# Patient Record
Sex: Male | Born: 1965 | Race: White | Hispanic: No | Marital: Married | Smoking: Never smoker
Health system: Southern US, Community
[De-identification: ages and names within clinical notes are randomized; demographics above are authoritative.]

## PROBLEM LIST (undated history)

## (undated) DIAGNOSIS — M6208 Separation of muscle (nontraumatic), other site: Secondary | ICD-10-CM

## (undated) DIAGNOSIS — I10 Essential (primary) hypertension: Secondary | ICD-10-CM

## (undated) DIAGNOSIS — F329 Major depressive disorder, single episode, unspecified: Secondary | ICD-10-CM

## (undated) DIAGNOSIS — K429 Umbilical hernia without obstruction or gangrene: Secondary | ICD-10-CM

## (undated) DIAGNOSIS — F32A Depression, unspecified: Secondary | ICD-10-CM

## (undated) DIAGNOSIS — E785 Hyperlipidemia, unspecified: Secondary | ICD-10-CM

## (undated) DIAGNOSIS — H5440 Blindness, one eye, unspecified eye: Secondary | ICD-10-CM

## (undated) DIAGNOSIS — F419 Anxiety disorder, unspecified: Secondary | ICD-10-CM

## (undated) HISTORY — PX: SHOULDER SURGERY: SHX246

## (undated) HISTORY — PX: KNEE SURGERY: SHX244

## (undated) HISTORY — PX: HERNIA REPAIR: SHX51

## (undated) HISTORY — PX: OTHER SURGICAL HISTORY: SHX169

## (undated) HISTORY — PX: ORCHIECTOMY: SHX2116

---

## 1998-07-06 ENCOUNTER — Encounter: Payer: Self-pay | Admitting: *Deleted

## 1998-07-06 ENCOUNTER — Emergency Department (HOSPITAL_COMMUNITY): Admission: EM | Admit: 1998-07-06 | Discharge: 1998-07-06 | Payer: Self-pay | Admitting: *Deleted

## 2005-07-28 ENCOUNTER — Ambulatory Visit: Payer: Self-pay | Admitting: Physical Medicine & Rehabilitation

## 2005-07-28 ENCOUNTER — Encounter
Admission: RE | Admit: 2005-07-28 | Discharge: 2005-10-26 | Payer: Self-pay | Admitting: Physical Medicine & Rehabilitation

## 2005-09-02 ENCOUNTER — Ambulatory Visit: Payer: Self-pay | Admitting: Physical Medicine & Rehabilitation

## 2005-10-04 ENCOUNTER — Ambulatory Visit: Payer: Self-pay | Admitting: Physical Medicine & Rehabilitation

## 2005-11-04 ENCOUNTER — Encounter
Admission: RE | Admit: 2005-11-04 | Discharge: 2006-02-02 | Payer: Self-pay | Admitting: Physical Medicine & Rehabilitation

## 2005-11-04 ENCOUNTER — Ambulatory Visit: Payer: Self-pay | Admitting: Physical Medicine & Rehabilitation

## 2006-01-05 ENCOUNTER — Ambulatory Visit: Payer: Self-pay | Admitting: Physical Medicine & Rehabilitation

## 2006-02-06 ENCOUNTER — Encounter
Admission: RE | Admit: 2006-02-06 | Discharge: 2006-05-07 | Payer: Self-pay | Admitting: Physical Medicine & Rehabilitation

## 2006-02-08 ENCOUNTER — Ambulatory Visit: Payer: Self-pay | Admitting: Physical Medicine & Rehabilitation

## 2006-03-13 ENCOUNTER — Ambulatory Visit: Payer: Self-pay | Admitting: Physical Medicine & Rehabilitation

## 2013-09-30 ENCOUNTER — Ambulatory Visit: Payer: Self-pay | Admitting: Surgery

## 2014-01-28 DIAGNOSIS — F419 Anxiety disorder, unspecified: Secondary | ICD-10-CM

## 2014-01-28 DIAGNOSIS — F329 Major depressive disorder, single episode, unspecified: Secondary | ICD-10-CM | POA: Insufficient documentation

## 2014-01-28 DIAGNOSIS — F32A Depression, unspecified: Secondary | ICD-10-CM | POA: Insufficient documentation

## 2014-01-28 DIAGNOSIS — I10 Essential (primary) hypertension: Secondary | ICD-10-CM | POA: Insufficient documentation

## 2014-01-28 DIAGNOSIS — E785 Hyperlipidemia, unspecified: Secondary | ICD-10-CM | POA: Insufficient documentation

## 2016-02-18 ENCOUNTER — Encounter: Payer: Self-pay | Admitting: *Deleted

## 2016-02-19 ENCOUNTER — Encounter: Payer: Self-pay | Admitting: Anesthesiology

## 2016-02-19 ENCOUNTER — Ambulatory Visit
Admission: RE | Admit: 2016-02-19 | Discharge: 2016-02-19 | Disposition: A | Discharge status: Home / Self Care | Payer: No Typology Code available for payment source | Source: Ambulatory Visit | Attending: Gastroenterology | Admitting: Gastroenterology

## 2016-02-19 ENCOUNTER — Encounter
Admission: RE | Disposition: A | Discharge status: Home / Self Care | Payer: Self-pay | Source: Ambulatory Visit | Attending: Gastroenterology

## 2016-02-19 ENCOUNTER — Ambulatory Visit: Payer: No Typology Code available for payment source | Admitting: Anesthesiology

## 2016-02-19 DIAGNOSIS — Z7982 Long term (current) use of aspirin: Secondary | ICD-10-CM | POA: Insufficient documentation

## 2016-02-19 DIAGNOSIS — Z8371 Family history of colonic polyps: Secondary | ICD-10-CM | POA: Diagnosis not present

## 2016-02-19 DIAGNOSIS — E785 Hyperlipidemia, unspecified: Secondary | ICD-10-CM | POA: Insufficient documentation

## 2016-02-19 DIAGNOSIS — F419 Anxiety disorder, unspecified: Secondary | ICD-10-CM | POA: Insufficient documentation

## 2016-02-19 DIAGNOSIS — K621 Rectal polyp: Secondary | ICD-10-CM | POA: Diagnosis not present

## 2016-02-19 DIAGNOSIS — Z1211 Encounter for screening for malignant neoplasm of colon: Secondary | ICD-10-CM | POA: Insufficient documentation

## 2016-02-19 DIAGNOSIS — F329 Major depressive disorder, single episode, unspecified: Secondary | ICD-10-CM | POA: Diagnosis not present

## 2016-02-19 DIAGNOSIS — Z79899 Other long term (current) drug therapy: Secondary | ICD-10-CM | POA: Insufficient documentation

## 2016-02-19 DIAGNOSIS — I10 Essential (primary) hypertension: Secondary | ICD-10-CM | POA: Insufficient documentation

## 2016-02-19 HISTORY — DX: Major depressive disorder, single episode, unspecified: F32.9

## 2016-02-19 HISTORY — DX: Essential (primary) hypertension: I10

## 2016-02-19 HISTORY — DX: Hyperlipidemia, unspecified: E78.5

## 2016-02-19 HISTORY — DX: Blindness, one eye, unspecified eye: H54.40

## 2016-02-19 HISTORY — DX: Depression, unspecified: F32.A

## 2016-02-19 HISTORY — DX: Anxiety disorder, unspecified: F41.9

## 2016-02-19 HISTORY — PX: COLONOSCOPY WITH PROPOFOL: SHX5780

## 2016-02-19 SURGERY — COLONOSCOPY WITH PROPOFOL
Anesthesia: General

## 2016-02-19 MED ORDER — PROPOFOL 10 MG/ML IV BOLUS
INTRAVENOUS | Status: DC | PRN
  Administered 2016-02-19: 90 mg via INTRAVENOUS

## 2016-02-19 MED ORDER — FENTANYL CITRATE (PF) 100 MCG/2ML IJ SOLN
INTRAMUSCULAR | Status: DC | PRN
  Administered 2016-02-19: 50 ug via INTRAVENOUS

## 2016-02-19 MED ORDER — SODIUM CHLORIDE 0.9 % IV SOLN: INTRAVENOUS | Status: DC

## 2016-02-19 MED ORDER — SODIUM CHLORIDE 0.9 % IV SOLN
INTRAVENOUS | Status: DC
  Administered 2016-02-19: 1000 mL via INTRAVENOUS
  Administered 2016-02-19: 16:00:00 via INTRAVENOUS

## 2016-02-19 MED ORDER — PROPOFOL 500 MG/50ML IV EMUL
INTRAVENOUS | Status: DC | PRN
  Administered 2016-02-19: 150 ug/kg/min via INTRAVENOUS

## 2016-02-19 MED ORDER — MIDAZOLAM HCL 2 MG/2ML IJ SOLN
INTRAMUSCULAR | Status: DC | PRN
  Administered 2016-02-19: 1 mg via INTRAVENOUS
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
## 2016-02-19 NOTE — H&P (Signed)
Outpatient short stay form Pre-procedure 02/19/2016 4:03 PM Christena DeemMartin U Oluwasemilore Bahl MD  Primary Physician: Dr. Aram BeechamJeffrey Sparks  Reason for visit:  Screening colonoscopy  History of present illness:  Patient is a 50 year old male presenting today for colonoscopy. This is first colonoscopy. He does take a daily 81 mg aspirin but has held that for several days. He takes no other aspirin products or blood thinning agents. He tolerated his prep well.    Current facility-administered medications:  .  0.9 %  sodium chloride infusion, , Intravenous, Continuous, Christena DeemMartin U Lilja Soland, MD, Last Rate: 20 mL/hr at 02/19/16 1500, 1,000 mL at 02/19/16 1500 .  0.9 %  sodium chloride infusion, , Intravenous, Continuous, Christena DeemMartin U Keiandre Cygan, MD  Prescriptions prior to admission  Medication Sig Dispense Refill Last Dose  . aspirin EC 81 MG tablet Take 81 mg by mouth daily.   Past Week at Unknown time  . bisoprolol-hydrochlorothiazide (ZIAC) 5-6.25 MG tablet Take 1 tablet by mouth daily.   02/18/2016 at Unknown time  . busPIRone (BUSPAR) 15 MG tablet Take 15 mg by mouth 2 (two) times daily.   02/18/2016 at Unknown time  . sertraline (ZOLOFT) 100 MG tablet Take 100 mg by mouth daily.   02/18/2016 at Unknown time  . sertraline (ZOLOFT) 50 MG tablet Take 50 mg by mouth at bedtime.   02/18/2016 at Unknown time     No Known Allergies   Past Medical History  Diagnosis Date  . Anxiety   . Hyperlipidemia   . Hypertension   . 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 Depression     Review of systems:      Physical Exam    Heart and lungs: Regular rate and rhythm without rub or gallop, lungs are bilaterally clear.    HEENT: Normocephalic atraumatic eyes are anicteric    Other:     Pertinant exam for procedure: Soft nontender nondistended bowel sounds positive normoactive.    Planned proceedures: Colonoscopy and indicated procedures. I have discussed the risks benefits and complications of procedures to include not  limited to bleeding, infection, perforation and the risk of sedation and the patient wishes to proceed.    Christena DeemMartin U Mariaeduarda Defranco, MD Gastroenterology 02/19/2016  4:03 PM

## 2016-02-19 NOTE — Anesthesia Postprocedure Evaluation (Signed)
Anesthesia Post Note  Patient: Michael Cooley  Procedure(s) Performed: Procedure(s) (LRB): COLONOSCOPY WITH PROPOFOL (N/A)  Patient location during evaluation: PACU Anesthesia Type: General Level of consciousness: awake and alert Pain management: pain level controlled Vital Signs Assessment: post-procedure vital signs reviewed and stable Respiratory status: spontaneous breathing, nonlabored ventilation, respiratory function stable and patient connected to nasal cannula oxygen Cardiovascular status: blood pressure returned to baseline and stable Postop Assessment: no signs of nausea or vomiting Anesthetic complications: no    Last Vitals:  Filed Vitals:   02/19/16 1654 02/19/16 1704  BP: 108/71 113/74  Pulse: 49 51  Temp:    Resp: 18 15    Last Pain: There were no vitals filed for this visit.               Yevette EdwardsJames G Adams

## 2016-02-19 NOTE — Transfer of Care (Signed)
Immediate Anesthesia Transfer of Care Note  Patient: Michael Cooley  Procedure(s) Performed: Procedure(s): COLONOSCOPY WITH PROPOFOL (N/A)  Patient Location: PACU and Endoscopy Unit  Anesthesia Type:General  Level of Consciousness: sedated  Airway & Oxygen Therapy: Patient Spontanous Breathing and Patient connected to nasal cannula oxygen  Post-op Assessment: Report given to RN and Post -op Vital signs reviewed and stable  Post vital signs: Reviewed and stable  Last Vitals:  Filed Vitals:   02/19/16 1501 02/19/16 1634  BP: 126/82 92/61  Pulse: 60 56  Temp: 36.3 C 35.8 C  Resp: 20 22    Complications: No apparent anesthesia complications

## 2016-02-19 NOTE — Anesthesia Procedure Notes (Signed)
Date/Time: 02/19/2016 4:16 PM Performed by: Junious SilkNOLES, Michael Spark Pre-anesthesia Checklist: Patient identified, Emergency Drugs available, Suction available, Patient being monitored and Timeout performed Oxygen Delivery Method: Nasal cannula

## 2016-02-19 NOTE — Op Note (Signed)
[facility] Gastroenterology Patient Name: Michael Cooley Procedure Date: 02/19/2016 3:57 PM MRN: [id number] Account #: [id number] Date of Birth: [birth year] Admit Type: Outpatient Age: 50 Room: [facility] ENDO ROOM 3 Gender: Male Note Status: Finalized Procedure:            Colonoscopy Indications:          Colon cancer screening in patient at increased risk:                        Family history of 1st-degree relative with colon polyps Providers:            Christena Deem, MD Referring MD:         Duane Lope. Judithann Sheen, MD (Referring MD) Medicines:            Monitored Anesthesia Care Complications:        No immediate complications. Procedure:            Pre-Anesthesia Assessment:                       - ASA Grade Assessment: II - A patient with mild                        systemic disease.                       After obtaining informed consent, the colonoscope was                        passed under direct vision. Throughout the procedure,                        the patient's blood pressure, pulse, and oxygen                        saturations were monitored continuously. The                        Colonoscope was introduced through the anus and                        advanced to the the cecum, identified by appendiceal                        orifice and ileocecal valve. The patient tolerated the                        procedure well. The quality of the bowel preparation                        was good except the ascending colon was fair. Findings:      A 2 mm polyp was found in the rectum. The polyp was sessile. The polyp       was removed with a cold biopsy forceps. Resection and retrieval were       complete.      The exam was otherwise without abnormality.      The digital rectal exam was normal. Impression:           - One 2 mm polyp in the rectum, removed with a cold  biopsy forceps. Resected and retrieved.                       - The  examination was otherwise normal. Recommendation:       - Discharge patient to home.                       - Telephone GI clinic for pathology results in 1 week. Procedure Code(s):    --- Professional ---                       [id number], Colonoscopy, flexible; with biopsy, single or                        multiple Diagnosis Code(s):    --- Professional ---                       Z83.71, Family history of colonic polyps                       K62.1, Rectal polyp CPT copyright 2016 American Medical Association. All rights reserved. The codes documented in this report are preliminary and upon coder review may  be revised to meet current compliance requirements. Christena DeemMartin U Vail Vuncannon, MD 02/19/2016 4:29:30 PM This report has been signed electronically. Number of Addenda: 0 Note Initiated On: 02/19/2016 3:57 PM Scope Withdrawal Time: 0 hours 7 minutes 29 seconds  Total Procedure Duration: 0 hours 14 minutes 59 seconds       [facility]

## 2016-02-22 ENCOUNTER — Encounter: Payer: Self-pay | Admitting: Gastroenterology

## 2016-02-23 LAB — SURGICAL PATHOLOGY

## 2017-02-01 ENCOUNTER — Ambulatory Visit (INDEPENDENT_AMBULATORY_CARE_PROVIDER_SITE_OTHER): Payer: Worker's Compensation

## 2017-02-01 ENCOUNTER — Encounter: Payer: Self-pay | Admitting: Emergency Medicine

## 2017-02-01 ENCOUNTER — Ambulatory Visit (INDEPENDENT_AMBULATORY_CARE_PROVIDER_SITE_OTHER): Payer: Worker's Compensation | Admitting: Emergency Medicine

## 2017-02-01 VITALS — BP 145/84 | HR 78 | Temp 98.3°F | Resp 18 | Ht 64.57 in | Wt 172.8 lb

## 2017-02-01 DIAGNOSIS — M549 Dorsalgia, unspecified: Secondary | ICD-10-CM

## 2017-02-01 DIAGNOSIS — M542 Cervicalgia: Secondary | ICD-10-CM

## 2017-02-01 MED ORDER — DICLOFENAC SODIUM 75 MG PO TBEC: 75.0000 mg | DELAYED_RELEASE_TABLET | Freq: Two times a day (BID) | ORAL | 0 refills | Status: AC

## 2017-02-01 MED ORDER — CYCLOBENZAPRINE HCL 10 MG PO TABS: 10.0000 mg | ORAL_TABLET | Freq: Three times a day (TID) | ORAL | 0 refills | Status: DC | PRN
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
## 2017-06-22 ENCOUNTER — Encounter
Admission: RE | Admit: 2017-06-22 | Discharge: 2017-06-22 | Disposition: A | Discharge status: Home / Self Care | Payer: No Typology Code available for payment source | Source: Ambulatory Visit | Attending: Orthopedic Surgery | Admitting: Orthopedic Surgery

## 2017-06-22 DIAGNOSIS — I10 Essential (primary) hypertension: Secondary | ICD-10-CM | POA: Diagnosis not present

## 2017-06-22 DIAGNOSIS — Z01810 Encounter for preprocedural cardiovascular examination: Secondary | ICD-10-CM | POA: Insufficient documentation

## 2017-06-22 DIAGNOSIS — R001 Bradycardia, unspecified: Secondary | ICD-10-CM | POA: Diagnosis not present

## 2017-06-22 DIAGNOSIS — E785 Hyperlipidemia, unspecified: Secondary | ICD-10-CM | POA: Diagnosis not present

## 2017-06-22 NOTE — Patient Instructions (Signed)
Your procedure is scheduled on: Thursday 06/29/17 Report to DAY SURGERY. 2ND FLOOR MEDICAL MALL ENTRANCE. To find out your arrival time please call [phone] between 1PM - 3PM on Wednesday 06/28/17.  Remember: Instructions that are not followed completely may result in serious medical risk, up to and including death, or upon the discretion of your surgeon and anesthesiologist your surgery may need to be rescheduled.    __X__ 1. Do not eat anything after midnight the night before your    procedure.  No gum chewing or hard candies.  You may drink clear   liquids up to 2 hours before you are scheduled to arrive at the   hospital for your procedure. Do not drink clear liquids within 2   hours of scheduled arrival to the hospital as this may lead to your   procedure being delayed or rescheduled.       Clear liquids include:   Water or Apple juice without pulp   Clear carbohydrate beverage such as Clearfast or Gatorade   Black coffee or Clear Tea (no milk, no creamer, do not add anything   to the coffee or tea)    Diabetics should only drink water   __X__ 2. No Alcohol for 24 hours before or after surgery.   ____ 3. Bring all medications with you on the day of surgery if instructed.    __X__ 4. Notify your doctor if there is any change in your medical condition     (cold, fever, infections).             __X___5. No smoking within 24 hours of your surgery.     Do not wear jewelry, make-up, hairpins, clips or nail polish.  Do not wear lotions, powders, or perfumes.   Do not shave 48 hours prior to surgery. Men may shave face and neck.  Do not bring valuables to the hospital.    [facility] is not responsible for any belongings or valuables.               Contacts, dentures or bridgework may not be worn into surgery.  Leave your suitcase in the car. After surgery it may be brought to your room.  For patients admitted to the hospital, discharge time is determined by your                 treatment team.   Patients discharged the day of surgery will not be allowed to drive home.   Please read over the following fact sheets that you were given:   MRSA Information   __x__ Take these medicines the morning of surgery with A SIP OF WATER:    1. None, take medications at bedtime as ususal  2.   3.   4.  5.  6.  ____ Fleet Enema (as directed)   __x__ Use CHG Soap/SAGE wipes as directed  ____ Use inhalers on the day of surgery  ____ Stop metformin 2 days prior to surgery    ____ Take 1/2 of usual insulin dose the night before surgery and none on the morning of surgery.   __x__ Stop Coumadin/Plavix/aspirin on Today (7 days before)  __X__ Stop Anti-inflammatories such as Advil, Aleve, Ibuprofen, Motrin, Naproxen, Naprosyn, Goodies,powder, or aspirin products.  OK to take Tylenol.   __X__ Stop supplements, Vitamin E, Fish Oil until after surgery.    ____ Bring C-Pap to the hospital.

## 2017-06-23 NOTE — Pre-Procedure Instructions (Signed)
EKG sent to Anesthesia for review. 

## 2017-06-28 MED ORDER — CEFAZOLIN SODIUM-DEXTROSE 2-4 GM/100ML-% IV SOLN
2.0000 g | Freq: Once | INTRAVENOUS | Status: AC
  Administered 2017-06-29: 2 g via INTRAVENOUS

## 2017-06-29 ENCOUNTER — Ambulatory Visit: Payer: No Typology Code available for payment source | Admitting: Anesthesiology

## 2017-06-29 ENCOUNTER — Encounter
Admission: RE | Disposition: A | Discharge status: Home / Self Care | Payer: Self-pay | Source: Ambulatory Visit | Attending: Orthopedic Surgery

## 2017-06-29 ENCOUNTER — Ambulatory Visit
Admission: RE | Admit: 2017-06-29 | Discharge: 2017-06-29 | Disposition: A | Discharge status: Home / Self Care | Payer: No Typology Code available for payment source | Source: Ambulatory Visit | Attending: Orthopedic Surgery | Admitting: Orthopedic Surgery

## 2017-06-29 DIAGNOSIS — I10 Essential (primary) hypertension: Secondary | ICD-10-CM | POA: Insufficient documentation

## 2017-06-29 DIAGNOSIS — E785 Hyperlipidemia, unspecified: Secondary | ICD-10-CM | POA: Insufficient documentation

## 2017-06-29 DIAGNOSIS — F419 Anxiety disorder, unspecified: Secondary | ICD-10-CM | POA: Insufficient documentation

## 2017-06-29 DIAGNOSIS — M67432 Ganglion, left wrist: Secondary | ICD-10-CM | POA: Diagnosis not present

## 2017-06-29 DIAGNOSIS — Z7982 Long term (current) use of aspirin: Secondary | ICD-10-CM | POA: Insufficient documentation

## 2017-06-29 DIAGNOSIS — Z79899 Other long term (current) drug therapy: Secondary | ICD-10-CM | POA: Diagnosis not present

## 2017-06-29 HISTORY — PX: GANGLION CYST EXCISION: SHX1691

## 2017-06-29 SURGERY — EXCISION, GANGLION CYST, WRIST
Anesthesia: General | Site: Wrist | Laterality: Left | Wound class: Clean

## 2017-06-29 MED ORDER — FAMOTIDINE 20 MG PO TABS
ORAL_TABLET | ORAL | Status: AC
  Administered 2017-06-29: 20 mg via ORAL
  Filled 2017-06-29: qty 1

## 2017-06-29 MED ORDER — CEFAZOLIN SODIUM-DEXTROSE 2-4 GM/100ML-% IV SOLN
INTRAVENOUS | Status: AC
  Filled 2017-06-29: qty 100

## 2017-06-29 MED ORDER — LIDOCAINE HCL (CARDIAC) 20 MG/ML IV SOLN
INTRAVENOUS | Status: DC | PRN
  Administered 2017-06-29: 100 mg via INTRAVENOUS

## 2017-06-29 MED ORDER — FENTANYL CITRATE (PF) 100 MCG/2ML IJ SOLN
INTRAMUSCULAR | Status: AC
  Filled 2017-06-29: qty 2

## 2017-06-29 MED ORDER — MIDAZOLAM HCL 2 MG/2ML IJ SOLN
INTRAMUSCULAR | Status: DC | PRN
  Administered 2017-06-29: 2 mg via INTRAVENOUS

## 2017-06-29 MED ORDER — PROPOFOL 10 MG/ML IV BOLUS
INTRAVENOUS | Status: DC | PRN
  Administered 2017-06-29: 180 mg via INTRAVENOUS

## 2017-06-29 MED ORDER — FENTANYL CITRATE (PF) 100 MCG/2ML IJ SOLN
INTRAMUSCULAR | Status: AC
  Administered 2017-06-29: 25 ug via INTRAVENOUS
  Filled 2017-06-29: qty 2

## 2017-06-29 MED ORDER — DEXAMETHASONE SODIUM PHOSPHATE 10 MG/ML IJ SOLN
INTRAMUSCULAR | Status: DC | PRN
  Administered 2017-06-29: 10 mg via INTRAVENOUS

## 2017-06-29 MED ORDER — BUPIVACAINE HCL (PF) 0.5 % IJ SOLN
INTRAMUSCULAR | Status: AC
  Filled 2017-06-29: qty 30

## 2017-06-29 MED ORDER — LACTATED RINGERS IV SOLN
INTRAVENOUS | Status: DC
  Administered 2017-06-29: 12:00:00 via INTRAVENOUS

## 2017-06-29 MED ORDER — HYDROCODONE-ACETAMINOPHEN 5-325 MG PO TABS: 1.0000 | ORAL_TABLET | Freq: Four times a day (QID) | ORAL | 0 refills | Status: DC | PRN

## 2017-06-29 MED ORDER — FAMOTIDINE 20 MG PO TABS
20.0000 mg | ORAL_TABLET | Freq: Once | ORAL | Status: AC
  Administered 2017-06-29: 20 mg via ORAL

## 2017-06-29 MED ORDER — PROMETHAZINE HCL 25 MG/ML IJ SOLN: 6.2500 mg | INTRAMUSCULAR | Status: DC | PRN

## 2017-06-29 MED ORDER — FENTANYL CITRATE (PF) 100 MCG/2ML IJ SOLN
25.0000 ug | INTRAMUSCULAR | Status: DC | PRN
  Administered 2017-06-29 (×2): 25 ug via INTRAVENOUS

## 2017-06-29 MED ORDER — DEXAMETHASONE SODIUM PHOSPHATE 10 MG/ML IJ SOLN
INTRAMUSCULAR | Status: AC
  Filled 2017-06-29: qty 1

## 2017-06-29 MED ORDER — PHENYLEPHRINE HCL 10 MG/ML IJ SOLN
INTRAMUSCULAR | Status: DC | PRN
  Administered 2017-06-29: 40 ug via INTRAVENOUS

## 2017-06-29 MED ORDER — FENTANYL CITRATE (PF) 100 MCG/2ML IJ SOLN
INTRAMUSCULAR | Status: DC | PRN
Start: 2017-06-29 — End: 2017-06-29
  Administered 2017-06-29 (×2): 50 ug via INTRAVENOUS

## 2017-06-29 MED ORDER — ONDANSETRON HCL 4 MG/2ML IJ SOLN
INTRAMUSCULAR | Status: DC | PRN
  Administered 2017-06-29: 4 mg via INTRAVENOUS

## 2017-06-29 MED ORDER — GLYCOPYRROLATE 0.2 MG/ML IJ SOLN
INTRAMUSCULAR | Status: AC
  Filled 2017-06-29: qty 1

## 2017-06-29 MED ORDER — MIDAZOLAM HCL 2 MG/2ML IJ SOLN
INTRAMUSCULAR | Status: AC
  Filled 2017-06-29: qty 2

## 2017-06-29 MED ORDER — SEVOFLURANE IN SOLN
RESPIRATORY_TRACT | Status: AC
  Filled 2017-06-29: qty 250

## 2017-06-29 MED ORDER — PROPOFOL 500 MG/50ML IV EMUL
INTRAVENOUS | Status: AC
  Filled 2017-06-29: qty 50

## 2017-06-29 MED ORDER — GLYCOPYRROLATE 0.2 MG/ML IJ SOLN
INTRAMUSCULAR | Status: DC | PRN
  Administered 2017-06-29: 0.2 mg via INTRAVENOUS

## 2017-06-29 MED ORDER — ONDANSETRON HCL 4 MG/2ML IJ SOLN
INTRAMUSCULAR | Status: AC
  Filled 2017-06-29: qty 2

## 2017-06-29 MED ORDER — SODIUM CHLORIDE 0.9 % IR SOLN
Status: DC | PRN
  Administered 2017-06-29: 500 mL

## 2017-06-29 SURGICAL SUPPLY — 29 items
BANDAGE ELASTIC 3 LF NS (GAUZE/BANDAGES/DRESSINGS) ×3 IMPLANT
CANISTER SUCT 1200ML W/VALVE (MISCELLANEOUS) ×3 IMPLANT
CAST PADDING 3X4FT ST 30246 (SOFTGOODS) ×2
CHLORAPREP W/TINT 26ML (MISCELLANEOUS) ×3 IMPLANT
CUFF TOURN 18 STER (MISCELLANEOUS) ×3 IMPLANT
ELECT CAUTERY NEEDLE 2.0 MIC (NEEDLE) ×3 IMPLANT
GAUZE PETRO XEROFOAM 1X8 (MISCELLANEOUS) ×3 IMPLANT
GAUZE SPONGE 4X4 12PLY STRL (GAUZE/BANDAGES/DRESSINGS) ×3 IMPLANT
GLOVE BIO SURGEON STRL SZ7.5 (GLOVE) ×3 IMPLANT
GLOVE BIOGEL PI IND STRL 7.0 (GLOVE) ×1 IMPLANT
GLOVE BIOGEL PI INDICATOR 7.0 (GLOVE) ×2
GLOVE SURG SYN 9.0  PF PI (GLOVE) ×2
GLOVE SURG SYN 9.0 PF PI (GLOVE) ×1 IMPLANT
GOWN SRG 2XL LVL 4 RGLN SLV (GOWNS) ×1 IMPLANT
GOWN STRL NON-REIN 2XL LVL4 (GOWNS) ×2
GOWN STRL REUS W/ TWL LRG LVL3 (GOWN DISPOSABLE) ×1 IMPLANT
GOWN STRL REUS W/TWL LRG LVL3 (GOWN DISPOSABLE) ×2
KIT RM TURNOVER STRD PROC AR (KITS) ×3 IMPLANT
NS IRRIG 500ML POUR BTL (IV SOLUTION) ×3 IMPLANT
PACK EXTREMITY ARMC (MISCELLANEOUS) ×3 IMPLANT
PAD CAST CTTN 3X4 STRL (SOFTGOODS) ×1 IMPLANT
SPLINT CAST 1 STEP 3X12 (MISCELLANEOUS) ×3 IMPLANT
SUT ETHILON 4-0 (SUTURE) ×2
SUT ETHILON 4-0 FS2 18XMFL BLK (SUTURE) ×1
SUT ETHILON 5-0 FS-2 18 BLK (SUTURE) ×3 IMPLANT
SUT MNCRL 4-0 (SUTURE)
SUT MNCRL 4-018XMFL (SUTURE)
SUTURE ETHLN 4-0 FS2 18XMF BLK (SUTURE) ×1 IMPLANT
SUTURE MNCRL 4-018XMF (SUTURE) IMPLANT

## 2017-06-29 NOTE — Op Note (Signed)
06/29/2017  2:19 PM  PATIENT:  Michael Cooley  51 y.o. male  PRE-OPERATIVE DIAGNOSIS:  GANGLION CYST OF VOLAR ASPECT OF LEFT WRIST   POST-OPERATIVE DIAGNOSIS:  GANGLION CYST OF VOLAR ASPECT OF LEFT WRIST   PROCEDURE:  Procedure(s): REMOVAL GANGLION OF WRIST (Left)  SURGEON: Leitha Schuller, MD  ASSISTANTS: None  ANESTHESIA:   general  EBL:  Total I/O In: 800 [I.V.:800] Out: 5 [Blood:5]  BLOOD ADMINISTERED:none  DRAINS: none   LOCAL MEDICATIONS USED:  NONE  SPECIMEN:  Source of Specimen:  Ganglion cyst left volar wrist  DISPOSITION OF SPECIMEN:  PATHOLOGY  COUNTS:  YES  TOURNIQUET:   Total Tourniquet Time Documented: Upper Arm (Left) - 6 minutes Total: Upper Arm (Left) - 6 minutes   IMPLANTS: None  DICTATION: .Dragon Dictation patient brought the operating room and after adequate anesthesia was obtained, the left arm was prepped and draped in sterile fashion. Tourniquet was applied to the upper arm and after appropriate patient education and timeout procedure were completed the tourniquet was raised to her 50 murmurs mercury. A curvilinear incision was made over the distal radius overlying the cyst which was then identified and separated from its adjacent tissue was then opened at the top and a portion of the capsule of the cyst was removed as a specimen. The base of the cyst was then identified at the joint at the radiocarpal joint and a rongeur was used to remove Ganglion around this followed by cauterization at the level of the capsule to try to cause scar tissue prevent recurrence. Tourniquet was let down at this point after removal of the mass and cauterizing the joint with hemostasis being achieved with electrocautery radial artery intact. The wound was irrigated and closed with simple interrupted 4-0 nylon skin sutures followed by Xeroform 4 x 4's web roll and a volar splint  PLAN OF CARE: Discharge to home after PACU  PATIENT DISPOSITION:  PACU - hemodynamically  stable.

## 2017-06-29 NOTE — Discharge Instructions (Signed)
Keep splint clean and dry. Work on finger motion is much as possible. Keep arm elevated. Pain medicine as directed

## 2017-06-29 NOTE — Transfer of Care (Signed)
Immediate Anesthesia Transfer of Care Note  Patient: Michael Cooley  Procedure(s) Performed: REMOVAL GANGLION OF WRIST (Left Wrist)  Patient Location: PACU  Anesthesia Type:General  Level of Consciousness: awake, alert  and oriented  Airway & Oxygen Therapy: Patient Spontanous Breathing and Patient connected to face mask oxygen  Post-op Assessment: Report given to RN and Post -op Vital signs reviewed and stable  Post vital signs: Reviewed and stable  Last Vitals:  Vitals:   06/29/17 1119  BP: 133/83  Pulse: (!) 52  Resp: 16  Temp: 36.6 C  SpO2: 100%    Last Pain:  Vitals:   06/29/17 1119  TempSrc: Oral  PainSc: 2          Complications: No apparent anesthesia complications

## 2017-06-29 NOTE — Anesthesia Procedure Notes (Signed)
Procedure Name: LMA Insertion Date/Time: 06/29/2017 2:01 PM Performed by: Paulette Blanch Pre-anesthesia Checklist: Patient identified, Patient being monitored, Timeout performed, Emergency Drugs available and Suction available Patient Re-evaluated:Patient Re-evaluated prior to induction Oxygen Delivery Method: Circle system utilized Preoxygenation: Pre-oxygenation with 100% oxygen Induction Type: IV induction Ventilation: Mask ventilation without difficulty LMA: LMA inserted LMA Size: 4.0 Tube type: Oral Number of attempts: 1 Placement Confirmation: positive ETCO2 and breath sounds checked- equal and bilateral Tube secured with: Tape Dental Injury: Teeth and Oropharynx as per pre-operative assessment

## 2017-06-29 NOTE — Anesthesia Post-op Follow-up Note (Signed)
Anesthesia QCDR form completed.        

## 2017-06-29 NOTE — Anesthesia Preprocedure Evaluation (Signed)
Anesthesia Evaluation  Patient identified by MRN, date of birth, ID band Patient awake    Reviewed: Allergy & Precautions, H&P , NPO status , Patient's Chart, lab work & pertinent test results, reviewed documented beta blocker date and time   History of Anesthesia Complications Negative for: history of anesthetic complications  Airway Mallampati: II  TM Distance: >3 FB Neck ROM: full    Dental  (+) Missing, Dental Advidsory Given, Teeth Intact   Pulmonary neg pulmonary ROS,           Cardiovascular Exercise Tolerance: Good hypertension, (-) angina(-) CAD, (-) Past MI, (-) Cardiac Stents and (-) CABG (-) dysrhythmias (-) Valvular Problems/Murmurs     Neuro/Psych PSYCHIATRIC DISORDERS (Depression) negative neurological ROS     GI/Hepatic negative GI ROS, Neg liver ROS,   Endo/Other  negative endocrine ROS  Renal/GU negative Renal ROS  negative genitourinary   Musculoskeletal   Abdominal   Peds  Hematology negative hematology ROS (+)   Anesthesia Other Findings Past Medical History: No date: Anxiety No date: Depression No date: Hyperlipidemia No date: Hypertension 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   Reproductive/Obstetrics negative OB ROS                             Anesthesia Physical Anesthesia Plan  ASA: II  Anesthesia Plan: General   Post-op Pain Management:    Induction: Intravenous  PONV Risk Score and Plan: 2 and Ondansetron and Dexamethasone  Airway Management Planned: LMA  Additional Equipment:   Intra-op Plan:   Post-operative Plan: Extubation in OR  Informed Consent: I have reviewed the patients History and Physical, chart, labs and discussed the procedure including the risks, benefits and alternatives for the proposed anesthesia with the patient or authorized representative who has indicated his/her understanding and acceptance.   Dental  Advisory Given  Plan Discussed with: Anesthesiologist, CRNA and Surgeon  Anesthesia Plan Comments:         Anesthesia Quick Evaluation

## 2017-06-29 NOTE — H&P (Signed)
Reviewed paper H+P, will be scanned into chart. No changes noted.  

## 2017-06-29 NOTE — Progress Notes (Signed)
Circulation positive to left hand   Can wiggle fingers  Skin warm and dry

## 2017-06-30 ENCOUNTER — Encounter: Payer: Self-pay | Admitting: Orthopedic Surgery

## 2017-06-30 NOTE — Anesthesia Postprocedure Evaluation (Signed)
Anesthesia Post Note  Patient: Michael Cooley  Procedure(s) Performed: REMOVAL GANGLION OF WRIST (Left Wrist)  Patient location during evaluation: PACU Anesthesia Type: General Level of consciousness: awake and alert Pain management: pain level controlled Vital Signs Assessment: post-procedure vital signs reviewed and stable Respiratory status: spontaneous breathing, nonlabored ventilation, respiratory function stable and patient connected to nasal cannula oxygen Cardiovascular status: blood pressure returned to baseline and stable Postop Assessment: no apparent nausea or vomiting Anesthetic complications: no     Last Vitals:  Vitals:   06/29/17 1528 06/29/17 1553  BP: (!) 111/52 110/71  Pulse: (!) 58 (!) 59  Resp: 16   Temp: (!) 36.3 C   SpO2: 94% 95%    Last Pain:  Vitals:   06/29/17 1528  TempSrc: Temporal  PainSc:                  Lenard Simmer

## 2017-07-03 LAB — SURGICAL PATHOLOGY

## 2017-11-15 ENCOUNTER — Other Ambulatory Visit: Payer: Self-pay | Admitting: Internal Medicine

## 2017-11-15 DIAGNOSIS — R0602 Shortness of breath: Secondary | ICD-10-CM

## 2017-11-16 ENCOUNTER — Ambulatory Visit
Admission: RE | Admit: 2017-11-16 | Discharge: 2017-11-16 | Disposition: A | Discharge status: Home / Self Care | Payer: BLUE CROSS/BLUE SHIELD | Source: Ambulatory Visit | Attending: Internal Medicine | Admitting: Internal Medicine

## 2017-11-16 DIAGNOSIS — R918 Other nonspecific abnormal finding of lung field: Secondary | ICD-10-CM | POA: Insufficient documentation

## 2017-11-16 DIAGNOSIS — R0602 Shortness of breath: Secondary | ICD-10-CM | POA: Diagnosis present

## 2017-11-16 DIAGNOSIS — I2510 Atherosclerotic heart disease of native coronary artery without angina pectoris: Secondary | ICD-10-CM | POA: Diagnosis not present

## 2017-11-16 DIAGNOSIS — J9811 Atelectasis: Secondary | ICD-10-CM | POA: Diagnosis not present

## 2017-11-16 MED ORDER — IOPAMIDOL (ISOVUE-370) INJECTION 76%
75.0000 mL | Freq: Once | INTRAVENOUS | Status: AC | PRN
  Administered 2017-11-16: 75 mL via INTRAVENOUS

## 2017-11-20 ENCOUNTER — Encounter: Payer: Self-pay | Admitting: Physician Assistant

## 2017-11-20 ENCOUNTER — Ambulatory Visit (INDEPENDENT_AMBULATORY_CARE_PROVIDER_SITE_OTHER): Payer: Self-pay | Admitting: Physician Assistant

## 2017-11-20 VITALS — BP 111/75 | HR 70 | Temp 97.8°F | Resp 16 | Ht 64.0 in | Wt 170.4 lb

## 2017-11-20 DIAGNOSIS — Z0289 Encounter for other administrative examinations: Secondary | ICD-10-CM

## 2017-11-20 NOTE — Progress Notes (Signed)
Airline pilotCommercial Driver Medical Examination   Suzanne Boronerry L Hunger is a 52 y.o. male with a pertinent medical history of well-controlled hypertension, mood disorder, dyslipidemia who presents today for a commercial driver fitness determination physical exam. The patient reports no problems today. In the past the patient reports receiving 1 year certificates. He denies focal neurological deficits, vision and hearing changes. He denies the habitual use of benzodiazepines, opioids, amphetamines and denies illicit drug use.   Current medications, family history, allergies, social history reviewed by me and exist elsewhere in the encounter.   Review of Systems  Constitutional: Negative for diaphoresis, malaise/fatigue and weight loss.  Respiratory: Negative for shortness of breath.   Cardiovascular: Negative for chest pain.  Genitourinary: Negative.   Neurological: Negative for dizziness, tingling, tremors, sensory change, speech change, focal weakness, seizures, loss of consciousness, weakness and headaches.    Objective:     Vision/hearing:  Visual Acuity Screening   Right eye Left eye Both eyes  Without correction:     With correction: 20/30 20/30 20/30   Comments: Color: passed Peripheral R 70 L 70  Hearing Screening Comments: Whisper test R 10 FEET L 10 FEET  Applicant can recognize and distinguish among traffic control signals and devices showing standard red, green, and amber colors.  Corrective lenses required: Yes  Monocular Vision?: No  Hearing aid requirement: No  Physical Exam  Constitutional: He appears well-developed. He is active and cooperative.  Non-toxic appearance.  HENT:  Right Ear: Hearing, tympanic membrane, external ear and ear canal normal.  Left Ear: Hearing, tympanic membrane, external ear and ear canal normal.  Nose: Nose normal. Right sinus exhibits no maxillary sinus tenderness and no frontal sinus tenderness. Left sinus exhibits no maxillary sinus  tenderness and no frontal sinus tenderness.  Mouth/Throat: Uvula is midline, oropharynx is clear and moist and mucous membranes are normal. No oropharyngeal exudate, posterior oropharyngeal edema or tonsillar abscesses.  Eyes: Conjunctivae are normal. Pupils are equal, round, and reactive to light.  Cardiovascular: Normal rate, regular rhythm, S1 normal, S2 normal, normal heart sounds, intact distal pulses and normal pulses. Exam reveals no gallop and no friction rub.  No murmur heard. Pulmonary/Chest: Effort normal. No stridor. No tachypnea. No respiratory distress. He has no wheezes. He has no rales.  Abdominal: He exhibits no distension.  Musculoskeletal: He exhibits no edema.  Lymphadenopathy:       Head (right side): No submandibular and no tonsillar adenopathy present.       Head (left side): No submandibular and no tonsillar adenopathy present.    He has no cervical adenopathy.  Neurological: He is alert.  Skin: Skin is warm and dry. He is not diaphoretic. No pallor.  Vitals reviewed.   BP 111/75 (BP Location: Right Arm, Patient Position: Sitting, Cuff Size: Large)   Pulse 70   Temp 97.8 F (36.6 C) (Oral)   Resp 16   Ht 5\' 4"  (1.626 m)   Wt 170 lb 6.4 oz (77.3 kg)   SpO2 96%   BMI 29.25 kg/m   Wt Readings from Last 3 Encounters:  11/20/17 170 lb 6.4 oz (77.3 kg)  06/29/17 174 lb (78.9 kg)  06/22/17 176 lb 3.2 oz (79.9 kg)   Temp Readings from Last 3 Encounters:  11/20/17 97.8 F (36.6 C) (Oral)  06/29/17 (!) 97.3 F (36.3 C) (Temporal)  02/01/17 98.3 F (36.8 C) (Oral)   BP Readings from Last 3 Encounters:  11/20/17 111/75  06/29/17 110/71  06/22/17 123/74   Pulse  Readings from Last 3 Encounters:  11/20/17 70  06/29/17 (!) 59  06/22/17 (!) 48     Labs: Comments: UA (SG:1.015; Protein:Neg; Blood: Neg; Glucose: Neg)    Assessment:    Healthy male exam.  Meets standards, but periodic monitoring required due to HTN.  Driver qualified only for 1 year.      Plan:    Medical examiners certificate completed and printed. Need follow-up in 1 year for recheck of HTN. Return as needed.    Deliah Boston, MS, PA-C 10:12 AM, 11/20/2017

## 2017-11-20 NOTE — Patient Instructions (Addendum)
  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  Thank you for choosing us for your care today.   IF you received an x-ray today, you will receive an [facility]. Please contact [facility] at [phone] with questions or concerns regarding your invoice.   IF you received labwork today, you will receive an invoice from [facility]. Please contact [facility] at [phone] with questions or concerns regarding your invoice.   Our billing staff will not be able to assist you with questions regarding bills from these companies.  You will be contacted with the lab results as soon as they are available. The fastest way to get your results is to activate your My Chart account. Instructions are located on the last page of this paperwork. If you have not heard from us regarding the results in 2 weeks, please contact this office.

## 2018-11-23 IMAGING — CT CT ANGIO CHEST
2 of 7 series · 18 of 46 positions shown · IV contrast (APPLIED)
Comparison: CT Abdomen and Pelvis 09/30/2013

CLINICAL DATA: 51-year-old male with shortness of breath. Recent
pneumonia, new onset upper chest pain.

EXAM:
CT ANGIOGRAPHY CHEST WITH CONTRAST
TECHNIQUE: Multidetector CT imaging of the chest was performed using the
standard protocol during bolus administration of intravenous
contrast. Multiplanar CT image reconstructions and MIPs were
obtained to evaluate the vascular anatomy.
CONTRAST:  75mL 11FHK9-OQM IOPAMIDOL (11FHK9-OQM) INJECTION 76%

[Series 5: thins (person_name) · axial · 0.66mm/px · z∈[-78,+167]mm · 15 of 277 slices shown]
[im 16/277  lung]
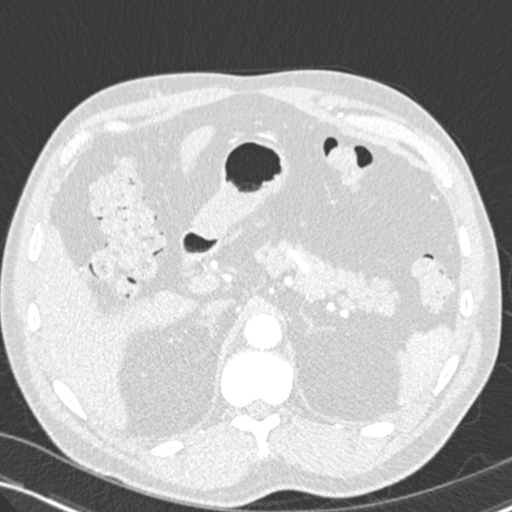
[im 31/277  soft-tissue]
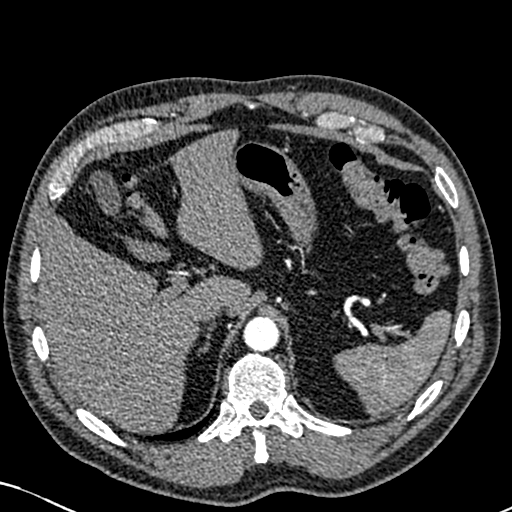
[im 47/277  lung]
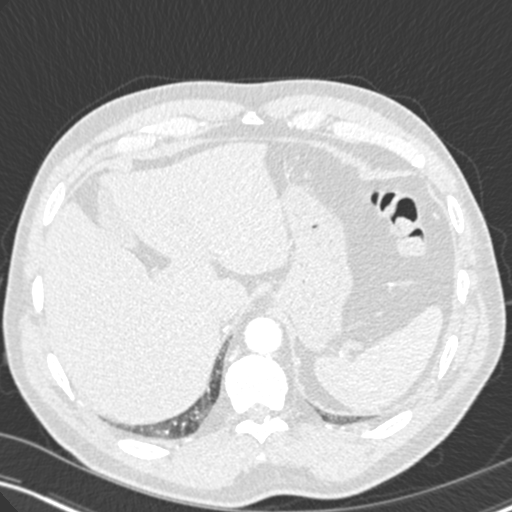
[im 62/277  soft-tissue]
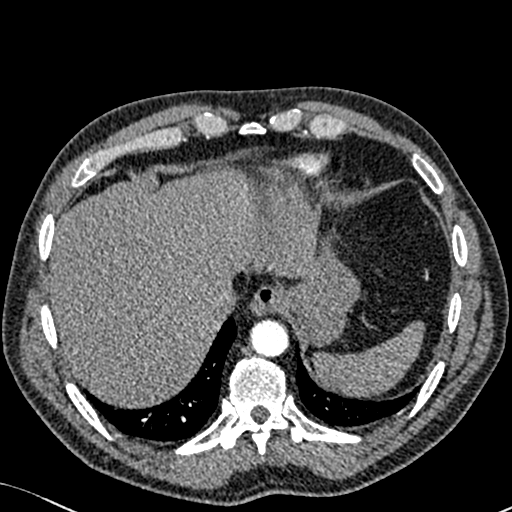
[im 93/277  lung]
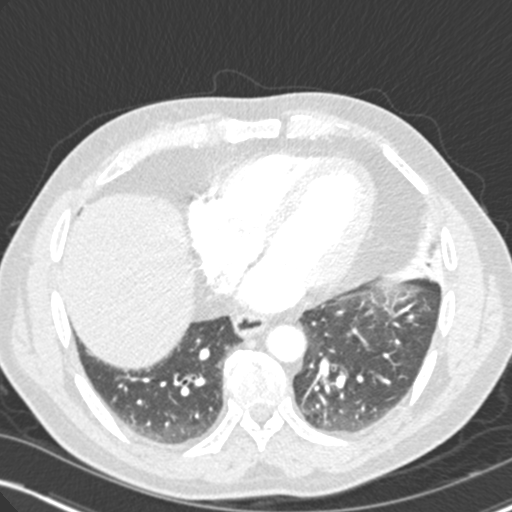
[im 108/277  soft-tissue]
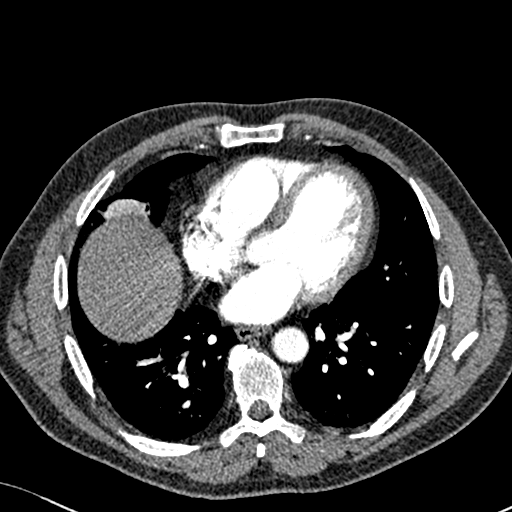
[im 123/277  lung]
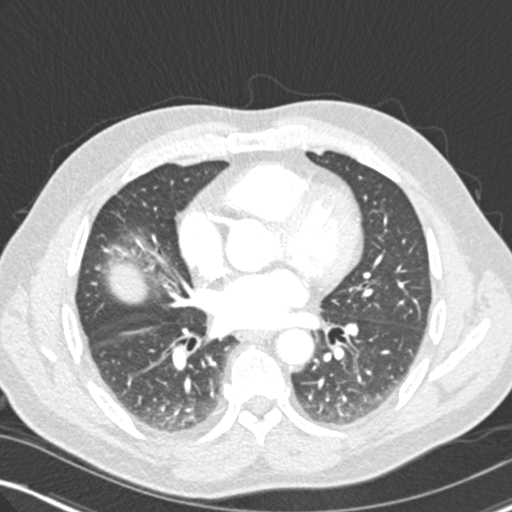
[im 139/277  soft-tissue]
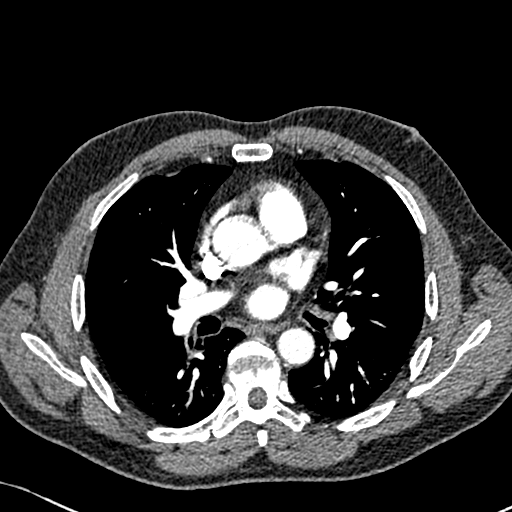
[im 154/277  lung]
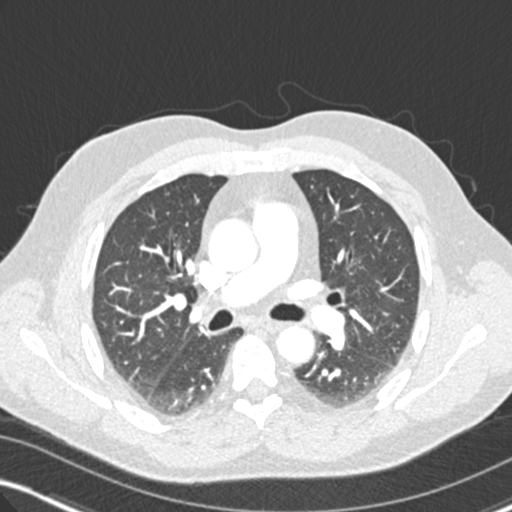
[im 169/277  soft-tissue]
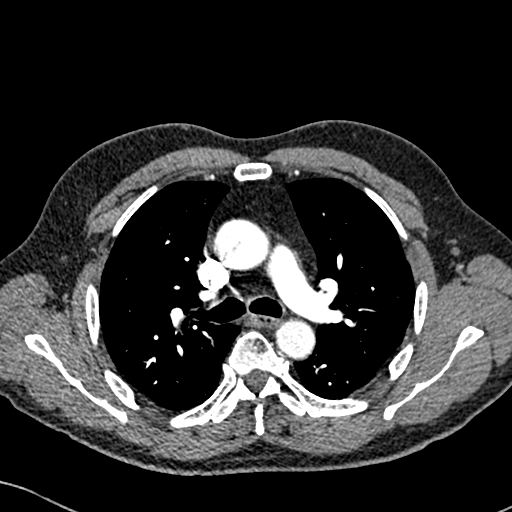
[im 185/277  lung]
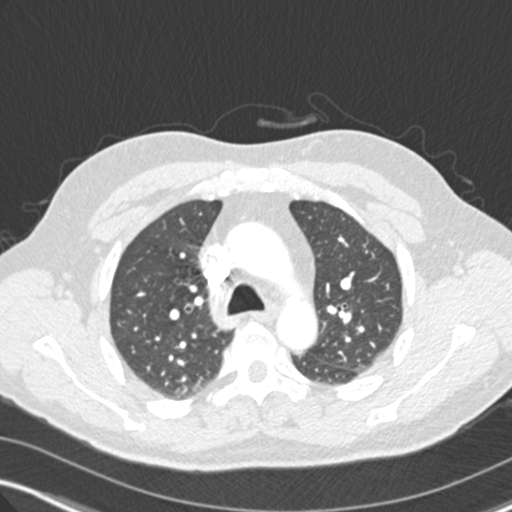
[im 215/277  soft-tissue]
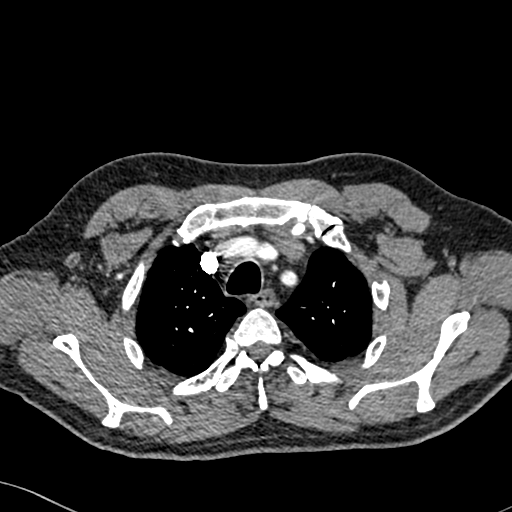
[im 231/277  lung]
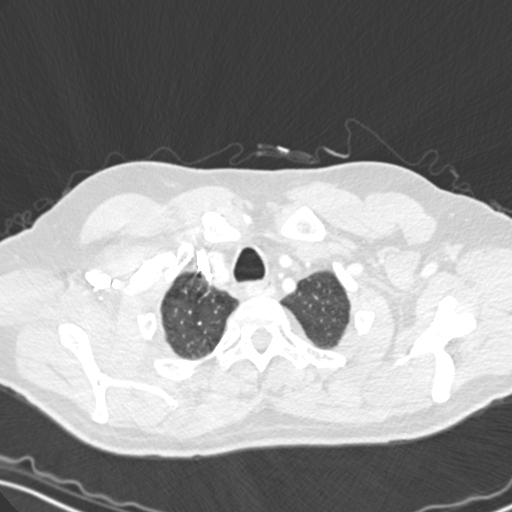
[im 246/277  soft-tissue]
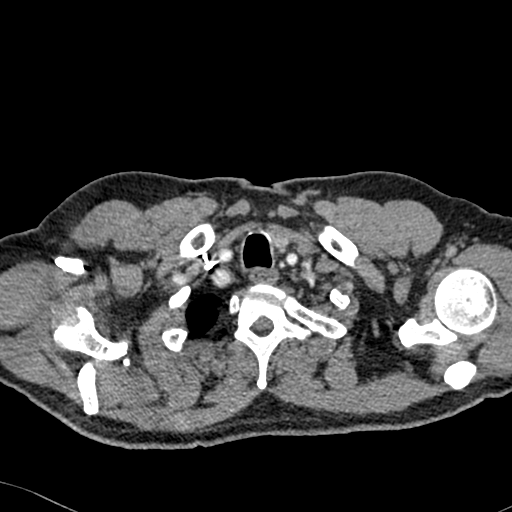
[im 261/277  lung]
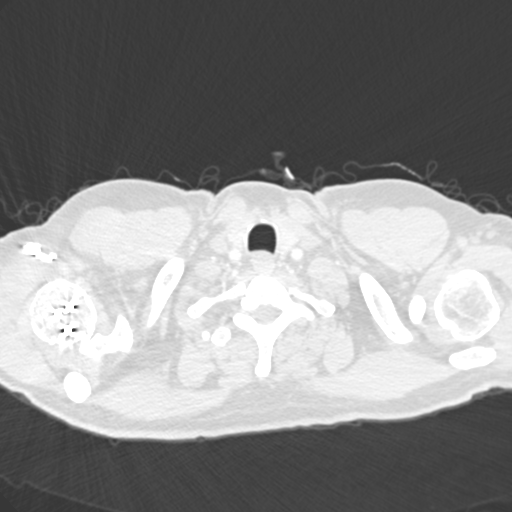

[Series 7: coronal mpr · coronal · 0.54mm/px · 3 of 79 slices shown]
[im 20/79  soft-tissue]
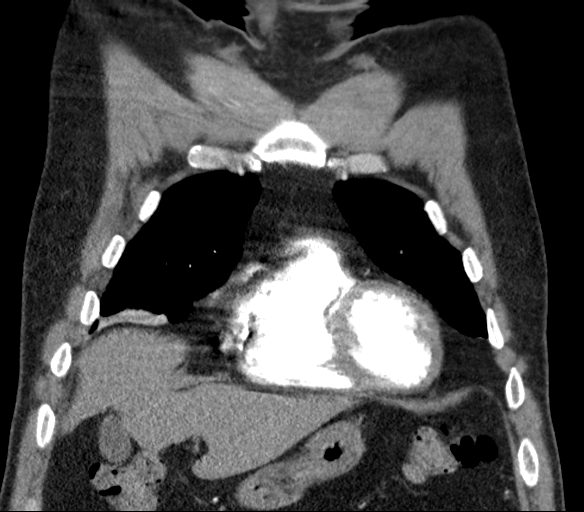
[im 40/79  soft-tissue]
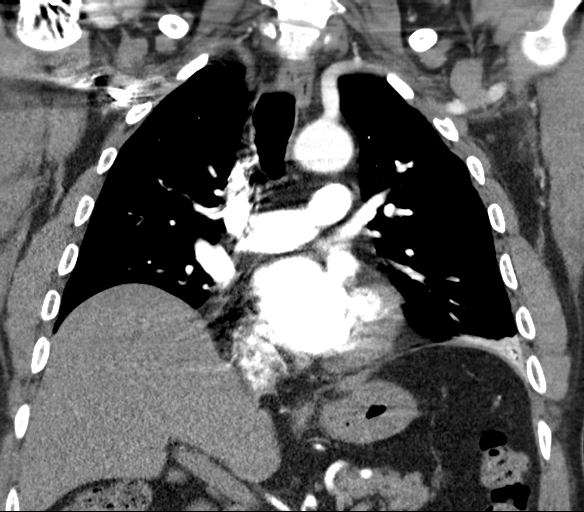
[im 59/79  soft-tissue]
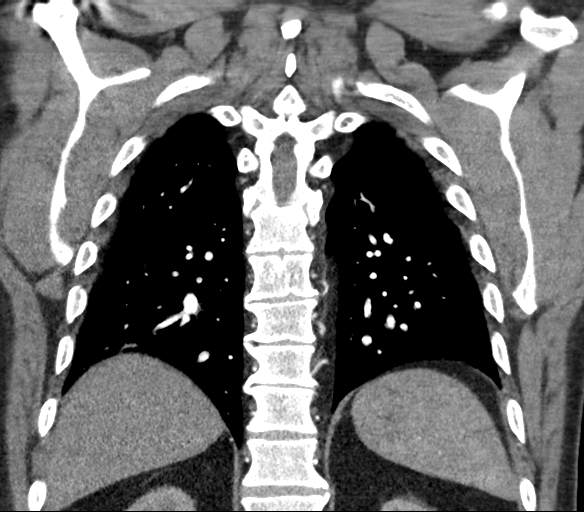

[18 of 46 positions shown; findings below may reference images not displayed]

FINDINGS: Cardiovascular: Good contrast bolus timing in the pulmonary arterial
tree.

No focal filling defect identified in the pulmonary arteries to
suggest acute pulmonary embolism.

Calcified coronary atherosclerosis (series 4, image 48). Cardiac
size remains within normal limits. No pericardial effusion. No
significant atherosclerosis of the visible aorta.

Mediastinum/Nodes: Negative, no mediastinal lymphadenopathy.

Lungs/Pleura: Low lung volumes. Trace retained secretions along the
right lateral wall of the trachea above the carina. Otherwise the
major airways are patent. Bilateral dependent pulmonary atelectasis
and crowding of lung markings. Lateral segment right middle lobe
atelectasis at the lung base. Lateral lingula atelectasis in the
costophrenic angle. Those atelectatic segments are enhancing as
expected. No consolidation. No pleural effusion.

Upper Abdomen: Negative visible liver, gallbladder, spleen,
pancreas, adrenal glands, renal upper poles, and bowel in the upper
abdomen.

Musculoskeletal: No acute osseous abnormality identified. Benign
vertebral body hemangioma in T5. Partially visible postoperative
changes to the right humeral head.

Review of the MIP images confirms the above findings.
IMPRESSION: 1.  Negative for acute pulmonary embolus.
2. Low lung volumes with atelectasis, but no other pulmonary
abnormality.
3. Calcified coronary artery atherosclerosis. Negative thoracic
aorta.

## 2022-01-05 ENCOUNTER — Other Ambulatory Visit: Payer: Self-pay | Admitting: Internal Medicine

## 2022-01-05 DIAGNOSIS — R1084 Generalized abdominal pain: Secondary | ICD-10-CM

## 2023-01-24 ENCOUNTER — Ambulatory Visit: Payer: Self-pay | Admitting: Surgery

## 2023-01-24 NOTE — H&P (Signed)
Subjective:   CC: Umbilical hernia without obstruction and without gangrene [K42.9]  HPI:  Michael Cooley is a 57 y.o. male who was referred by Marguarite Arbour, MD for evaluation of above. Symptoms were first noted 1 year ago. Pain is achy and intermittent, confined to the periumbilical area, without radiation.  Increasing in size. Associated with nothing, exacerbated by nothing.  Lump is reducible.    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  Past Surgical History:  Past Surgical History:  Procedure Laterality Date   COLONOSCOPY  02/19/2016   Dr. Eber Hong @ [facility] - Hyperplastic Polyp, FHPolyps(f), rpt 5 yrs per [facility]   removal ganglion of left wrist  Left 06/29/2017   Dr.Menz   COLONOSCOPY  05/28/2021   Normal colon/FHx CP/Repeat 47yrs/[facility]   INGUINAL HERNIA REPAIR     knee surgery     ORCHIECTOMY     Removal of a nail from sinus cavity     shoulder surgery      Family History: family history includes Colon polyps in his father; Coronary Artery Disease (Blocked arteries around heart) in his father; No Known Problems in his mother.  Social History:  reports that he has never smoked. He has never used smokeless tobacco. He reports current alcohol use. He reports that he does not use drugs.  Current Medications: has a current medication list which includes the following prescription(s): albuterol mdi (proventil, ventolin, proair) hfa, aspirin, bisoprolol-hydrochlorothiazide, buspirone, fluticasone propionate, imipramine, loratadine, pravastatin, sertraline, sildenafil, tamsulosin, and topiramate.  Allergies:  Allergies as of 01/24/2023   (No Known Allergies)    ROS:  A 15 point review of systems was performed and pertinent positives and negatives noted in HPI   Objective:     BP 130/79   Pulse 66   Ht 165 cm (5' 4.96")   Wt 80.7 kg (178 lb)   BMI 29.66 kg/m    Constitutional :  Alert, cooperative, no distress  Lymphatics/Throat:  Supple, no lymphadenopathy  Respiratory:  clear to auscultation bilaterally  Cardiovascular:  regular rate and rhythm  Gastrointestinal: soft, non-tender; bowel sounds normal; no masses,  no organomegaly. umbilical hernia noted.  moderate and no overlying skin changes. Moderate diastasis recti also noted.  Musculoskeletal: Steady gait and movement  Skin: Cool and moist  Psychiatric: Normal affect, non-agitated, not confused       LABS:  N/a   RADS: N/a Assessment:       Umbilical hernia without obstruction and without gangrene [K42.9]  Diastasis recti also noted.  Discussed how this is not repaired at time of surgery. Plan:     1. Umbilical hernia without obstruction and without gangrene [K42.9]   Discussed the risk of surgery including recurrence, which can be up to 50% in the case of incisional or complex hernias, possible use of prosthetic materials (mesh) and the increased risk of mesh infxn if used, bleeding, chronic pain, post-op infxn, post-op SBO or ileus, and possible re-operation to address said risks. The risks of general anesthetic, if used, includes MI, CVA, sudden death or even reaction to anesthetic medications also discussed. Alternatives include continued observation.  Benefits include possible symptom relief, prevention of incarceration, strangulation, enlargement in size over time, and the risk of emergency surgery in the face of strangulation.   Typical post-op recovery time of 3-5 days with 2 weeks of activity restrictions were also discussed.  ED return precautions given  for sudden increase in pain, size of hernia with accompanying fever, nausea, and/or vomiting.  The patient verbalized understanding and all questions were answered to the patient's satisfaction.   2. Patient has elected to proceed with surgical treatment. Procedure will be scheduled. robotic assisted laparoscopic  Hold  aspirin  labs/images/medications/previous chart entries reviewed personally and relevant changes/updates noted above.

## 2023-01-24 NOTE — H&P (View-Only) (Signed)
Subjective:   CC: Umbilical hernia without obstruction and without gangrene [K42.9]  HPI:  Michael Cooley is a 56 y.o. male who was referred by Jeffrey D Sparks, MD for evaluation of above. Symptoms were first noted 1 year ago. Pain is achy and intermittent, confined to the periumbilical area, without radiation.  Increasing in size. Associated with nothing, exacerbated by nothing.  Lump is reducible.    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  Past Surgical History:  Past Surgical History:  Procedure Laterality Date   COLONOSCOPY  02/19/2016   Dr. M. Skulskie @ [facility] - Hyperplastic Polyp, FHPolyps(f), rpt 5 yrs per [facility]   removal ganglion of left wrist  Left 06/29/2017   Dr.Menz   COLONOSCOPY  05/28/2021   Normal colon/FHx CP/Repeat 5yrs/[facility]KT   INGUINAL HERNIA REPAIR     knee surgery     ORCHIECTOMY     Removal of a nail from sinus cavity     shoulder surgery      Family History: family history includes Colon polyps in his father; Coronary Artery Disease (Blocked arteries around heart) in his father; No Known Problems in his mother.  Social History:  reports that he has never smoked. He has never used smokeless tobacco. He reports current alcohol use. He reports that he does not use drugs.  Current Medications: has a current medication list which includes the following prescription(s): albuterol mdi (proventil, ventolin, proair) hfa, aspirin, bisoprolol-hydrochlorothiazide, buspirone, fluticasone propionate, imipramine, loratadine, pravastatin, sertraline, sildenafil, tamsulosin, and topiramate.  Allergies:  Allergies as of 01/24/2023   (No Known Allergies)    ROS:  A 15 point review of systems was performed and pertinent positives and negatives noted in HPI   Objective:     BP 130/79   Pulse 66   Ht 165 cm (5' 4.96")   Wt 80.7 kg (178 lb)   BMI 29.66 kg/m    Constitutional :  Alert, cooperative, no distress  Lymphatics/Throat:  Supple, no lymphadenopathy  Respiratory:  clear to auscultation bilaterally  Cardiovascular:  regular rate and rhythm  Gastrointestinal: soft, non-tender; bowel sounds normal; no masses,  no organomegaly. umbilical hernia noted.  moderate and no overlying skin changes. Moderate diastasis recti also noted.  Musculoskeletal: Steady gait and movement  Skin: Cool and moist  Psychiatric: Normal affect, non-agitated, not confused       LABS:  N/a   RADS: N/a Assessment:       Umbilical hernia without obstruction and without gangrene [K42.9]  Diastasis recti also noted.  Discussed how this is not repaired at time of surgery. Plan:     1. Umbilical hernia without obstruction and without gangrene [K42.9]   Discussed the risk of surgery including recurrence, which can be up to 50% in the case of incisional or complex hernias, possible use of prosthetic materials (mesh) and the increased risk of mesh infxn if used, bleeding, chronic pain, post-op infxn, post-op SBO or ileus, and possible re-operation to address said risks. The risks of general anesthetic, if used, includes MI, CVA, sudden death or even reaction to anesthetic medications also discussed. Alternatives include continued observation.  Benefits include possible symptom relief, prevention of incarceration, strangulation, enlargement in size over time, and the risk of emergency surgery in the face of strangulation.   Typical post-op recovery time of 3-5 days with 2 weeks of activity restrictions were also discussed.  ED return precautions given   for sudden increase in pain, size of hernia with accompanying fever, nausea, and/or vomiting.  The patient verbalized understanding and all questions were answered to the patient's satisfaction.   2. Patient has elected to proceed with surgical treatment. Procedure will be scheduled. robotic assisted laparoscopic  Hold  aspirin  labs/images/medications/previous chart entries reviewed personally and relevant changes/updates noted above.   

## 2023-02-08 ENCOUNTER — Encounter
Admission: RE | Admit: 2023-02-08 | Discharge: 2023-02-08 | Disposition: A | Discharge status: Home / Self Care | Payer: BLUE CROSS/BLUE SHIELD | Source: Ambulatory Visit | Attending: Surgery | Admitting: Surgery

## 2023-02-08 ENCOUNTER — Encounter: Payer: Self-pay | Admitting: Surgery

## 2023-02-08 ENCOUNTER — Other Ambulatory Visit: Payer: Self-pay

## 2023-02-08 VITALS — Ht 64.9 in | Wt 178.0 lb

## 2023-02-08 DIAGNOSIS — I10 Essential (primary) hypertension: Secondary | ICD-10-CM

## 2023-02-08 NOTE — Patient Instructions (Addendum)
Your procedure is scheduled on: May 31/2024  Report to the Registration Desk on the 1st floor of the [facility] To find out your arrival time, please call [phone] between 1PM - 3PM on:  02/16/2023 If your arrival time is 6:00 am, do not arrive before that time as the [facility] entrance doors do not open until 6:00 am.  REMEMBER: Instructions that are not followed completely may result in serious medical risk, up to and including death; or upon the discretion of your surgeon and anesthesiologist your surgery may need to be rescheduled.  Do not eat food after midnight the night before surgery.  No gum chewing or hard candies.  You may however, drink CLEAR liquids up to 2 hours before you are scheduled to arrive for your surgery. Do not drink anything within 2 hours of your scheduled arrival time. Please remember the cut off time. !!!  Clear liquids include: - water  - apple juice without pulp - gatorade (not RED colors) - black coffee or tea (Do NOT add milk or creamers to the coffee or tea) Do NOT drink anything that is not on this list.    One week prior to surgery  Stop Anti-inflammatories (NSAIDS) such as Advil, Aleve, Ibuprofen, Motrin, Naproxen, Naprosyn and Aspirin based products such as Excedrin, Goody's Powder, BC Powder. Stop ANY OVER THE COUNTER supplements until after surgery. You may however, continue to take Tylenol if needed for pain up until the day of surgery.  Continue taking all prescribed medications with the exception of the following:    Aspirin----------------------------Follow recommendations from Cardiologist or PCP or surgeon regarding stopping blood thinners. Your pre-op RN will ask you the date of last dose.    Viagra- --------------------------If you are using this for erectile dysfunction only, you need to hold this for two days prior to surgery. Do not take this for two days before surgery. If you are using this for                                                  something else , please call us back to get and instructions on what to do. Other nurse can help you with that question.     No Alcohol for 24 hours before or after surgery.  No Smoking including e-cigarettes for 24 hours before surgery.  No chewable tobacco products for at least 6 hours before surgery.  No nicotine patches on the day of surgery.  Do not use any "recreational" drugs for at least a week (preferably 2 weeks) before your surgery.  Please be advised that the combination of cocaine and anesthesia may have negative outcomes, up to and including death. If you test positive for cocaine, your surgery will be cancelled.  On the morning of surgery brush your teeth with toothpaste and water, you may rinse your mouth with mouthwash if you wish. Do not swallow any toothpaste or mouthwash.  Use CHG Soap  as directed on instruction sheet.-provided for you   Do not wear jewelry, make-up, hairpins, clips or nail polish.  Do not wear lotions, powders, or perfumes.   Do not shave body hair from the neck down 48 hours before surgery.  Contact lenses, hearing aids and dentures may not be worn into surgery.  Do not bring valuables to the hospital. [facility] is not responsible  for any missing/lost belongings or valuables.   Notify your doctor if there is any change in your medical condition (cold, fever, infection).  Wear comfortable clothing (specific to your surgery type) to the hospital.  After surgery, you can help prevent lung complications by doing breathing exercises.  Take deep breaths and cough every 1-2 hours. Your doctor may order a device called an Incentive Spirometer to help you take deep breaths. When coughing or sneezing, hold a pillow firmly against your incision with both hands. This is called "splinting." Doing this helps protect your incision. It also decreases belly discomfort.   If you are being discharged the day of surgery, you will not be  allowed to drive home. You will need a responsible individual to drive you home and stay with you for 24 hours after surgery.    Please call the Pre-admissions Testing Dept. at [phone] if you have any questions about these instructions.  Surgery Visitation Policy:  Patients having surgery or a procedure may have two visitors.  Children under the age of 18 must have an adult with them who is not the patient.  Inpatient Visitation:    Visiting hours are 7 a.m. to 8 p.m. Up to four visitors are allowed at one time in a patient room. The visitors may rotate out with other people during the day.  One visitor age 70 or older may stay with the patient overnight and must be in the room by 8 p.m.                                                    Follow the instructions  with regards to using CHG soap     Preparing for Surgery with CHLORHEXIDINE GLUCONATE (CHG) Soap  Chlorhexidine Gluconate (CHG) Soap  o An antiseptic cleaner that kills germs and bonds with the skin to continue killing germs even after washing  o Used for showering the night before surgery and morning of surgery  Before surgery, you can play an important role by reducing the number of germs on your skin.  CHG (Chlorhexidine gluconate) soap is an antiseptic cleanser which kills germs and bonds with the skin to continue killing germs even after washing.  Please do not use if you have an allergy to CHG or antibacterial soaps. If your skin becomes reddened/irritated stop using the CHG.  1. Shower the NIGHT BEFORE SURGERY and the MORNING OF SURGERY with CHG soap.  2. If you choose to wash your hair, wash your hair first as usual with your normal shampoo.  3. After shampooing, rinse your hair and body thoroughly to remove the shampoo.  4. Use CHG as you would any other liquid soap. You can apply CHG directly to the skin and wash gently with a scrungie or a clean washcloth.  5. Apply the CHG soap to your body  only from the neck down. Do not use on open wounds or open sores. Avoid contact with your eyes, ears, mouth, and genitals (private parts). Wash face and genitals (private parts) with your normal soap.  6. Wash thoroughly, paying special attention to the area where your surgery will be performed.  7. Thoroughly rinse your body with warm water.  8. Do not shower/wash with your normal soap after using and rinsing off the CHG soap.  9. Pat yourself dry  with a clean towel.  10. Wear clean pajamas to bed the night before surgery.  12. Place clean sheets on your bed the night of your first shower and do not sleep with pets.  13. Shower again with the CHG soap on the day of surgery prior to arriving at the hospital.  14. Do not apply any deodorants/lotions/powders.  15. Please wear clean clothes to the hospital.

## 2023-02-09 ENCOUNTER — Encounter
Admission: RE | Admit: 2023-02-09 | Discharge: 2023-02-09 | Disposition: A | Discharge status: Home / Self Care | Payer: BC Managed Care – PPO | Source: Ambulatory Visit | Attending: Surgery | Admitting: Surgery

## 2023-02-09 DIAGNOSIS — Z01810 Encounter for preprocedural cardiovascular examination: Secondary | ICD-10-CM | POA: Diagnosis present

## 2023-02-09 DIAGNOSIS — I10 Essential (primary) hypertension: Secondary | ICD-10-CM | POA: Diagnosis not present

## 2023-02-17 ENCOUNTER — Ambulatory Visit
Admission: RE | Admit: 2023-02-17 | Discharge: 2023-02-17 | Disposition: A | Discharge status: Home / Self Care | Payer: BC Managed Care – PPO | Attending: Surgery | Admitting: Surgery

## 2023-02-17 ENCOUNTER — Encounter: Payer: Self-pay | Admitting: Surgery

## 2023-02-17 ENCOUNTER — Other Ambulatory Visit: Payer: Self-pay

## 2023-02-17 ENCOUNTER — Ambulatory Visit: Payer: BC Managed Care – PPO | Admitting: Anesthesiology

## 2023-02-17 ENCOUNTER — Encounter
Admission: RE | Disposition: A | Discharge status: Home / Self Care | Payer: Self-pay | Source: Home / Self Care | Attending: Surgery

## 2023-02-17 DIAGNOSIS — K420 Umbilical hernia with obstruction, without gangrene: Secondary | ICD-10-CM | POA: Diagnosis not present

## 2023-02-17 DIAGNOSIS — M6208 Separation of muscle (nontraumatic), other site: Secondary | ICD-10-CM | POA: Insufficient documentation

## 2023-02-17 DIAGNOSIS — K429 Umbilical hernia without obstruction or gangrene: Secondary | ICD-10-CM | POA: Diagnosis present

## 2023-02-17 HISTORY — DX: Umbilical hernia without obstruction or gangrene: K42.9

## 2023-02-17 HISTORY — DX: Separation of muscle (nontraumatic), other site: M62.08

## 2023-02-17 SURGERY — REPAIR, HERNIA, UMBILICAL, ROBOT-ASSISTED
Anesthesia: General | Site: Abdomen

## 2023-02-17 MED ORDER — SUGAMMADEX SODIUM 200 MG/2ML IV SOLN
INTRAVENOUS | Status: DC | PRN
  Administered 2023-02-17: 200 mg via INTRAVENOUS

## 2023-02-17 MED ORDER — BUPIVACAINE LIPOSOME 1.3 % IJ SUSP
INTRAMUSCULAR | Status: AC
  Filled 2023-02-17: qty 20

## 2023-02-17 MED ORDER — CELECOXIB 200 MG PO CAPS
200.0000 mg | ORAL_CAPSULE | ORAL | Status: AC
  Administered 2023-02-17: 200 mg via ORAL

## 2023-02-17 MED ORDER — OXYCODONE HCL 5 MG/5ML PO SOLN: 5.0000 mg | Freq: Once | ORAL | Status: AC | PRN

## 2023-02-17 MED ORDER — CHLORHEXIDINE GLUCONATE 0.12 % MT SOLN
OROMUCOSAL | Status: AC
  Filled 2023-02-17: qty 15

## 2023-02-17 MED ORDER — FAMOTIDINE 20 MG PO TABS
20.0000 mg | ORAL_TABLET | Freq: Once | ORAL | Status: AC
  Administered 2023-02-17: 20 mg via ORAL

## 2023-02-17 MED ORDER — PROPOFOL 10 MG/ML IV BOLUS
INTRAVENOUS | Status: AC
  Filled 2023-02-17: qty 20

## 2023-02-17 MED ORDER — MIDAZOLAM HCL 2 MG/2ML IJ SOLN
INTRAMUSCULAR | Status: DC | PRN
  Administered 2023-02-17: 2 mg via INTRAVENOUS

## 2023-02-17 MED ORDER — EPINEPHRINE PF 1 MG/ML IJ SOLN
INTRAMUSCULAR | Status: AC
  Filled 2023-02-17: qty 1

## 2023-02-17 MED ORDER — 0.9 % SODIUM CHLORIDE (POUR BTL) OPTIME
TOPICAL | Status: DC | PRN
  Administered 2023-02-17: 500 mL

## 2023-02-17 MED ORDER — GLYCOPYRROLATE 0.2 MG/ML IJ SOLN
INTRAMUSCULAR | Status: DC | PRN
  Administered 2023-02-17: .4 mg via INTRAVENOUS

## 2023-02-17 MED ORDER — SODIUM CHLORIDE (PF) 0.9 % IJ SOLN
INTRAMUSCULAR | Status: DC | PRN
  Administered 2023-02-17: 80 mL

## 2023-02-17 MED ORDER — ONDANSETRON 4 MG PO TBDP: 4.0000 mg | ORAL_TABLET | Freq: Three times a day (TID) | ORAL | 0 refills | Status: AC | PRN

## 2023-02-17 MED ORDER — IBUPROFEN 800 MG PO TABS: 800.0000 mg | ORAL_TABLET | Freq: Three times a day (TID) | ORAL | 0 refills | Status: AC | PRN

## 2023-02-17 MED ORDER — MIDAZOLAM HCL 2 MG/2ML IJ SOLN
INTRAMUSCULAR | Status: AC
  Filled 2023-02-17: qty 2

## 2023-02-17 MED ORDER — CEFAZOLIN SODIUM-DEXTROSE 2-4 GM/100ML-% IV SOLN
INTRAVENOUS | Status: AC
  Filled 2023-02-17: qty 100

## 2023-02-17 MED ORDER — FENTANYL CITRATE (PF) 100 MCG/2ML IJ SOLN
25.0000 ug | INTRAMUSCULAR | Status: DC | PRN
  Administered 2023-02-17 (×2): 50 ug via INTRAVENOUS

## 2023-02-17 MED ORDER — LIDOCAINE HCL (CARDIAC) PF 100 MG/5ML IV SOSY
PREFILLED_SYRINGE | INTRAVENOUS | Status: DC | PRN
  Administered 2023-02-17: 100 mg via INTRAVENOUS

## 2023-02-17 MED ORDER — BUPIVACAINE-EPINEPHRINE 0.5% -1:200000 IJ SOLN
INTRAMUSCULAR | Status: DC | PRN
  Administered 2023-02-17: 20 mL

## 2023-02-17 MED ORDER — GABAPENTIN 300 MG PO CAPS
ORAL_CAPSULE | ORAL | Status: AC
  Filled 2023-02-17: qty 1

## 2023-02-17 MED ORDER — ACETAMINOPHEN 500 MG PO TABS
ORAL_TABLET | ORAL | Status: AC
  Filled 2023-02-17: qty 2

## 2023-02-17 MED ORDER — OXYCODONE-ACETAMINOPHEN 5-325 MG PO TABS: 1.0000 | ORAL_TABLET | Freq: Three times a day (TID) | ORAL | 0 refills | Status: AC | PRN

## 2023-02-17 MED ORDER — EPHEDRINE SULFATE (PRESSORS) 50 MG/ML IJ SOLN
INTRAMUSCULAR | Status: DC | PRN
  Administered 2023-02-17 (×2): 10 mg via INTRAVENOUS

## 2023-02-17 MED ORDER — CELECOXIB 200 MG PO CAPS
ORAL_CAPSULE | ORAL | Status: AC
  Filled 2023-02-17: qty 1

## 2023-02-17 MED ORDER — ORAL CARE MOUTH RINSE: 15.0000 mL | Freq: Once | OROMUCOSAL | Status: AC

## 2023-02-17 MED ORDER — OXYCODONE HCL 5 MG PO TABS
ORAL_TABLET | ORAL | Status: AC
  Filled 2023-02-17: qty 1

## 2023-02-17 MED ORDER — DEXAMETHASONE SODIUM PHOSPHATE 10 MG/ML IJ SOLN
INTRAMUSCULAR | Status: DC | PRN
  Administered 2023-02-17: 10 mg via INTRAVENOUS

## 2023-02-17 MED ORDER — BUPIVACAINE HCL (PF) 0.5 % IJ SOLN
INTRAMUSCULAR | Status: AC
  Filled 2023-02-17: qty 30

## 2023-02-17 MED ORDER — CEFAZOLIN SODIUM-DEXTROSE 2-4 GM/100ML-% IV SOLN
2.0000 g | INTRAVENOUS | Status: AC
  Administered 2023-02-17: 2 g via INTRAVENOUS

## 2023-02-17 MED ORDER — FAMOTIDINE 20 MG PO TABS
ORAL_TABLET | ORAL | Status: AC
  Filled 2023-02-17: qty 1

## 2023-02-17 MED ORDER — GLYCOPYRROLATE 0.2 MG/ML IJ SOLN: INTRAMUSCULAR | Status: DC | PRN

## 2023-02-17 MED ORDER — ACETAMINOPHEN 325 MG PO TABS: 650.0000 mg | ORAL_TABLET | Freq: Three times a day (TID) | ORAL | 0 refills | Status: AC | PRN

## 2023-02-17 MED ORDER — GABAPENTIN 300 MG PO CAPS
300.0000 mg | ORAL_CAPSULE | ORAL | Status: AC
  Administered 2023-02-17: 300 mg via ORAL

## 2023-02-17 MED ORDER — ONDANSETRON HCL 4 MG/2ML IJ SOLN: 4.0000 mg | Freq: Once | INTRAMUSCULAR | Status: DC | PRN

## 2023-02-17 MED ORDER — OXYCODONE HCL 5 MG PO TABS
5.0000 mg | ORAL_TABLET | Freq: Once | ORAL | Status: AC | PRN
  Administered 2023-02-17: 5 mg via ORAL

## 2023-02-17 MED ORDER — CHLORHEXIDINE GLUCONATE 0.12 % MT SOLN
15.0000 mL | Freq: Once | OROMUCOSAL | Status: AC
  Administered 2023-02-17: 15 mL via OROMUCOSAL

## 2023-02-17 MED ORDER — SODIUM CHLORIDE (PF) 0.9 % IJ SOLN
INTRAMUSCULAR | Status: AC
  Filled 2023-02-17: qty 50

## 2023-02-17 MED ORDER — PROPOFOL 10 MG/ML IV BOLUS: INTRAVENOUS | Status: DC | PRN

## 2023-02-17 MED ORDER — PROPOFOL 10 MG/ML IV BOLUS
INTRAVENOUS | Status: DC | PRN
  Administered 2023-02-17: 150 mg via INTRAVENOUS

## 2023-02-17 MED ORDER — CHLORHEXIDINE GLUCONATE CLOTH 2 % EX PADS
6.0000 | MEDICATED_PAD | Freq: Once | CUTANEOUS | Status: AC
  Administered 2023-02-17: 6 via TOPICAL

## 2023-02-17 MED ORDER — ONDANSETRON HCL 4 MG/2ML IJ SOLN
INTRAMUSCULAR | Status: DC | PRN
  Administered 2023-02-17: 4 mg via INTRAVENOUS

## 2023-02-17 MED ORDER — FENTANYL CITRATE (PF) 100 MCG/2ML IJ SOLN
INTRAMUSCULAR | Status: DC | PRN
  Administered 2023-02-17 (×2): 50 ug via INTRAVENOUS

## 2023-02-17 MED ORDER — FENTANYL CITRATE (PF) 100 MCG/2ML IJ SOLN
INTRAMUSCULAR | Status: AC
  Filled 2023-02-17: qty 2

## 2023-02-17 MED ORDER — DOCUSATE SODIUM 100 MG PO CAPS: 100.0000 mg | ORAL_CAPSULE | Freq: Two times a day (BID) | ORAL | 0 refills | Status: AC | PRN

## 2023-02-17 MED ORDER — ACETAMINOPHEN 10 MG/ML IV SOLN: 1000.0000 mg | Freq: Once | INTRAVENOUS | Status: DC | PRN

## 2023-02-17 MED ORDER — ROCURONIUM BROMIDE 100 MG/10ML IV SOLN
INTRAVENOUS | Status: DC | PRN
  Administered 2023-02-17: 50 mg via INTRAVENOUS
  Administered 2023-02-17: 20 mg via INTRAVENOUS

## 2023-02-17 MED ORDER — ACETAMINOPHEN 500 MG PO TABS
1000.0000 mg | ORAL_TABLET | ORAL | Status: AC
  Administered 2023-02-17: 1000 mg via ORAL

## 2023-02-17 MED ORDER — LACTATED RINGERS IV SOLN: INTRAVENOUS | Status: DC

## 2023-02-17 SURGICAL SUPPLY — 51 items
ADH SKN CLS APL DERMABOND .7 (GAUZE/BANDAGES/DRESSINGS) ×1
BLADE SURG SZ11 CARB STEEL (BLADE) ×1 IMPLANT
COVER TIP SHEARS 8 DVNC (MISCELLANEOUS) ×1 IMPLANT
COVER WAND RF STERILE (DRAPES) ×1 IMPLANT
DERMABOND ADVANCED .7 DNX12 (GAUZE/BANDAGES/DRESSINGS) ×1 IMPLANT
DRAPE ARM DVNC X/XI (DISPOSABLE) ×3 IMPLANT
DRAPE COLUMN DVNC XI (DISPOSABLE) ×1 IMPLANT
ELECT CAUTERY BLADE 6.4 (BLADE) ×1 IMPLANT
ELECT REM PT RETURN 9FT ADLT (ELECTROSURGICAL) ×1
ELECTRODE REM PT RTRN 9FT ADLT (ELECTROSURGICAL) ×1 IMPLANT
FORCEPS BPLR FENES DVNC XI (FORCEP) ×1 IMPLANT
GLOVE BIOGEL PI IND STRL 7.0 (GLOVE) ×2 IMPLANT
GLOVE SURG SYN 6.5 ES PF (GLOVE) ×6 IMPLANT
GLOVE SURG SYN 6.5 PF PI (GLOVE) ×2 IMPLANT
GOWN STRL REUS W/ TWL LRG LVL3 (GOWN DISPOSABLE) ×3 IMPLANT
GOWN STRL REUS W/TWL LRG LVL3 (GOWN DISPOSABLE) ×3
GRASPER SUT TROCAR 14GX15 (MISCELLANEOUS) IMPLANT
IRRIGATOR SUCT 8 DISP DVNC XI (IRRIGATION / IRRIGATOR) IMPLANT
IV NS 1000ML (IV SOLUTION)
IV NS 1000ML BAXH (IV SOLUTION) IMPLANT
LABEL OR SOLS (LABEL) ×1 IMPLANT
MANIFOLD NEPTUNE II (INSTRUMENTS) ×1 IMPLANT
MESH PROGRIP HERNIA FLAT 15X15 (Mesh General) IMPLANT
NDL DRIVE SUT CUT DVNC (INSTRUMENTS) ×1 IMPLANT
NDL HYPO 22X1.5 SAFETY MO (MISCELLANEOUS) ×1 IMPLANT
NDL INSUFFLATION 14GA 120MM (NEEDLE) ×1 IMPLANT
NEEDLE DRIVE SUT CUT DVNC (INSTRUMENTS) ×1 IMPLANT
NEEDLE HYPO 22X1.5 SAFETY MO (MISCELLANEOUS) ×1 IMPLANT
NEEDLE INSUFFLATION 14GA 120MM (NEEDLE) ×1 IMPLANT
OBTURATOR OPTICAL STND 8 DVNC (TROCAR) ×1
OBTURATOR OPTICALSTD 8 DVNC (TROCAR) ×1 IMPLANT
PACK LAP CHOLECYSTECTOMY (MISCELLANEOUS) ×1 IMPLANT
PENCIL SMOKE EVACUATOR (MISCELLANEOUS) ×1 IMPLANT
SCISSORS MNPLR CVD DVNC XI (INSTRUMENTS) ×1 IMPLANT
SEAL UNIV 5-12 XI (MISCELLANEOUS) ×3 IMPLANT
SET TUBE SMOKE EVAC HIGH FLOW (TUBING) ×1 IMPLANT
SOL ELECTROSURG ANTI STICK (MISCELLANEOUS) ×1
SOLUTION ELECTROSURG ANTI STCK (MISCELLANEOUS) ×1 IMPLANT
SUT MNCRL AB 4-0 PS2 18 (SUTURE) ×1 IMPLANT
SUT STRATAFIX 0 PDS+ CT-2 23 (SUTURE) ×1
SUT V-LOC 90 ABS DVC 3-0 CL (SUTURE) ×2 IMPLANT
SUT VIC AB 3-0 SH 27 (SUTURE) ×1
SUT VIC AB 3-0 SH 27X BRD (SUTURE) ×1 IMPLANT
SUT VICRYL 0 UR6 27IN ABS (SUTURE) ×1 IMPLANT
SUTURE STRATFX 0 PDS+ CT-2 23 (SUTURE) ×1 IMPLANT
SYR 30ML LL (SYRINGE) ×1 IMPLANT
SYSTEM WECK SHIELD CLOSURE (TROCAR) IMPLANT
TRAP FLUID SMOKE EVACUATOR (MISCELLANEOUS) ×1 IMPLANT
TRAY FOLEY MTR SLVR 16FR STAT (SET/KITS/TRAYS/PACK) ×1 IMPLANT
TROCAR Z-THREAD FIOS 5X100MM (TROCAR) IMPLANT
WATER STERILE IRR 500ML POUR (IV SOLUTION) ×1 IMPLANT

## 2023-02-17 NOTE — Anesthesia Preprocedure Evaluation (Signed)
Anesthesia Evaluation  Patient identified by MRN, date of birth, ID band Patient awake    Reviewed: Allergy & Precautions, NPO status , Patient's Chart, lab work & pertinent test results  History of Anesthesia Complications Negative for: history of anesthetic complications  Airway Mallampati: II  TM Distance: >3 FB Neck ROM: Full    Dental  (+) Missing, Chipped   Pulmonary neg pulmonary ROS, neg sleep apnea, neg COPD, Patient abstained from smoking.Not current smoker   Pulmonary exam normal breath sounds clear to auscultation       Cardiovascular Exercise Tolerance: Good METShypertension, Pt. 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 No date: Umbilical hernia without obstruction and without gangrene  Reproductive/Obstetrics                             Anesthesia Physical Anesthesia Plan  ASA: 2  Anesthesia Plan: General   Post-op Pain Management: Tylenol PO (pre-op)*, Celebrex PO (pre-op)* and Gabapentin PO (pre-op)*   Induction: Intravenous  PONV Risk Score and Plan: 3 and Ondansetron, Dexamethasone and Midazolam  Airway Management Planned: Oral ETT  Additional Equipment: None  Intra-op Plan:   Post-operative Plan: Extubation in OR  Informed Consent: I have reviewed the patients History and Physical, chart, labs and discussed the procedure  including the risks, benefits and alternatives for the proposed anesthesia with the patient or authorized representative who has indicated his/her understanding and acceptance.     Dental advisory given  Plan Discussed with: CRNA and Surgeon  Anesthesia Plan Comments: (Discussed risks of anesthesia with patient, including PONV, sore throat, lip/dental/eye damage. Rare risks discussed as well, such as cardiorespiratory and neurological sequelae, and allergic reactions. Discussed the role of CRNA in patient's perioperative care. Patient understands.)       Anesthesia Quick Evaluation

## 2023-02-17 NOTE — Interval H&P Note (Signed)
No change. OK to proceed.

## 2023-02-17 NOTE — Anesthesia Procedure Notes (Signed)
Procedure Name: Intubation Date/Time: 02/17/2023 7:41 AM  Performed by: Danelle Berry, CRNAPre-anesthesia Checklist: Patient identified, Emergency Drugs available, Suction available and Patient being monitored Patient Re-evaluated:Patient Re-evaluated prior to induction Oxygen Delivery Method: Circle system utilized Preoxygenation: Pre-oxygenation with 100% oxygen Induction Type: IV induction Ventilation: Mask ventilation without difficulty Laryngoscope Size: McGraph and 3 Grade View: Grade I Tube type: Oral Tube size: 7.5 mm Number of attempts: 1 Airway Equipment and Method: Stylet and Oral airway Placement Confirmation: ETT inserted through vocal cords under direct vision, positive ETCO2 and breath sounds checked- equal and bilateral Secured at: 21 cm Tube secured with: Tape Dental Injury: Teeth and Oropharynx as per pre-operative assessment

## 2023-02-17 NOTE — Discharge Instructions (Addendum)
Hernia repair, Care After This sheet gives you information about how to care for yourself after your procedure. Your health care provider may also give you more specific instructions. If you have problems or questions, contact your health care provider. What can I expect after the procedure? After your procedure, it is common to have the following: Pain in your abdomen, especially in the incision areas. You will be given medicine to control the pain. Tiredness. This is a normal part of the recovery process. Your energy level will return to normal over the next several weeks. Changes in your bowel movements, such as constipation or needing to go more often. Talk with your health care provider about how to manage this. Follow these instructions at home: Medicines  tylenol and advil as needed for discomfort.  Please alternate between the two every four hours as needed for pain.    Use narcotics, if prescribed, only when tylenol and motrin is not enough to control pain.  325-650mg every 8hrs to max of 3000mg/24hrs (including the 325mg in every norco dose) for the tylenol.    Advil up to 800mg per dose every 8hrs as needed for pain.   PLEASE RECORD NUMBER OF PILLS TAKEN UNTIL NEXT FOLLOW UP APPT.  THIS WILL HELP DETERMINE HOW READY YOU ARE TO BE RELEASED FROM ANY ACTIVITY RESTRICTIONS Do not drive or use heavy machinery while taking prescription pain medicine. Do not drink alcohol while taking prescription pain medicine.  Incision care    Follow instructions from your health care provider about how to take care of your incision areas. Make sure you: Keep your incisions clean and dry. Wash your hands with soap and water before and after applying medicine to the areas, and before and after changing your bandage (dressing). If soap and water are not available, use hand sanitizer. Change your dressing as told by your health care provider. Leave stitches (sutures), skin glue, or adhesive strips in  place. These skin closures may need to stay in place for 2 weeks or longer. If adhesive strip edges start to loosen and curl up, you may trim the loose edges. Do not remove adhesive strips completely unless your health care provider tells you to do that. Do not wear tight clothing over the incisions. Tight clothing may rub and irritate the incision areas, which may cause the incisions to open. Do not take baths, swim, or use a hot tub until your health care provider approves. OK TO SHOWER IN 24HRS.   Check your incision area every day for signs of infection. Check for: More redness, swelling, or pain. More fluid or blood. Warmth. Pus or a bad smell. Activity Avoid lifting anything that is heavier than 10 lb (4.5 kg) for 2 weeks or until your health care provider says it is okay. No pushing/pulling greater than 30lbs You may resume normal activities as told by your health care provider. Ask your health care provider what activities are safe for you. Take rest breaks during the day as needed. Eating and drinking Follow instructions from your health care provider about what you can eat after surgery. To prevent or treat constipation while you are taking prescription pain medicine, your health care provider may recommend that you: Drink enough fluid to keep your urine clear or pale yellow. Take over-the-counter or prescription medicines. Eat foods that are high in fiber, such as fresh fruits and vegetables, whole grains, and beans. Limit foods that are high in fat and processed sugars, such as fried and   sweet foods. General instructions Ask your health care provider when you will need an appointment to get your sutures or staples removed. Keep all follow-up visits as told by your health care provider. This is important. Contact a health care provider if: You have more redness, swelling, or pain around your incisions. You have more fluid or blood coming from the incisions. Your incisions feel  warm to the touch. You have pus or a bad smell coming from your incisions or your dressing. You have a fever. You have an incision that breaks open (edges not staying together) after sutures or staples have been removed. You develop a rash. You have chest pain or difficulty breathing. You have pain or swelling in your legs. You feel light-headed or you faint. Your abdomen swells (becomes distended). You have nausea or vomiting. You have blood in your stool (feces). This information is not intended to replace advice given to you by your health care provider. Make sure you discuss any questions you have with your health care provider. Document Released: 03/25/2005 Document Revised: 05/25/2018 Document Reviewed: 06/06/2016 Elsevier Interactive Patient Education  2019 [employer]   AMBULATORY SURGERY  DISCHARGE INSTRUCTIONS   The drugs that you were given will stay in your system until tomorrow so for the next 24 hours you should not:  Drive an automobile Make any legal decisions Drink any alcoholic beverage   You may resume regular meals tomorrow.  Today it is better to start with liquids and gradually work up to solid foods.  You may eat anything you prefer, but it is better to start with liquids, then soup and crackers, and gradually work up to solid foods.   Please notify your doctor immediately if you have any unusual bleeding, trouble breathing, redness and pain at the surgery site, drainage, fever, or pain not relieved by medication.    Additional Instructions:        Please contact your physician with any problems or Same Day Surgery at [phone], Monday through Friday 6 am to 4 pm, or [facility] Main number at [phone].  

## 2023-02-17 NOTE — Anesthesia Postprocedure Evaluation (Signed)
Anesthesia Post Note  Patient: Michael Cooley  Procedure(s) Performed: XI ROBOT ASSISTED UMBILICAL HERNIA REPAIR w/ mesh (Abdomen)  Patient location during evaluation: PACU Anesthesia Type: General Level of consciousness: awake and alert Pain management: pain level controlled Vital Signs Assessment: post-procedure vital signs reviewed and stable Respiratory status: spontaneous breathing, nonlabored ventilation, respiratory function stable and patient connected to nasal cannula oxygen Cardiovascular status: blood pressure returned to baseline and stable Postop Assessment: no apparent nausea or vomiting Anesthetic complications: no   No notable events documented.   Last Vitals:  Vitals:   02/17/23 1058 02/17/23 1107  BP: 98/61 101/68  Pulse: 62 (!) 56  Resp: 20 18  Temp: (!) 36.1 C (!) 36.1 C  SpO2: 94% 96%    Last Pain:  Vitals:   02/17/23 1107  TempSrc: Temporal  PainSc: 2                  Corinda Gubler

## 2023-02-17 NOTE — Op Note (Signed)
Preoperative diagnosis: umbilical, initial, incarcerated hernia Postoperative diagnosis: same  Procedure: Robotic assisted laparoscopic umbilical  hernia repair with mesh  Anesthesia: general  Surgeon: Sung Amabile  Wound Classification: Clean  Specimen: none  Complications: None  Estimated Blood Loss: 10ml  Indications:see HPI  Findings: umbilical hernia defect measuring 2.1 x 2.4cm 4. Tension free repair achieved with ProGrip mesh and suture 5. Adequate hemostasis  Description of procedure: The patient was brought to the operating room and general anesthesia was induced. A time-out was completed verifying correct patient, procedure, site, positioning, and implant(s) and/or special equipment prior to beginning this procedure. Antibiotics were administered prior to making the incision. SCDs placed. The anterior abdominal wall was prepped and draped in the standard sterile fashion.   Palmer's point chosen for entry.  Veress needle placed and abdomen insufflated to 15cm without any dramatic increase in pressure.  Needle removed and optiview technique used to place 5mm port at same point.  No injury noted during placement. 3 additional ports, 8mm x2 and 12mm, along left lateral aspect placed.   Xi robot then docked into place.  Instruments unfortunately could not reach hernia with initial setup, so additional 8mm port place higher in LUQ, all arms docked slightly higher.  This allowed better use of arms, so procedure continued robotically.   Umbilical hernia defect measuring 2.1cm x 2.4cm was noted.  Preperitoneal plane was entered by making a incision along the right lateral aspect of the peritoneum.  This flap was carried across the abdomen to the other side of the defect, reducing all hernia contents and preperitoneal lipoma within the hernia defect.  Small amounts of bleeding was controlled with cautery.  Once adequate exposure of the defect and adequate space was created to place the  mesh, insufflation dropped to 8mm and transfacial suture with 0 stratafix used to primarily close defect under minimal tension.  ProGrip mesh cut to size with adequate overlap around the defect edges was placed within the abdominal cavity through 12mm port and secured to the abdominal wall centered over the defect. The peritoneal flap was then closed with a running 3-0 V lock.  Robot was undocked.  The 12mm cannula was removed and port site was closed using PMI device and 0 vicryl suture, ensuring no bowels were injured during this process.  Abdomen then desufflated while camera within abdomen to ensure no signs of new bleed prior to removing camera and rest of ports completely.  All skin incisions closed with runninrg 4-0 Monocryl in a subcuticular fashion.  All wounds then dressed with Dermabond.  Patient was then successfully awakened and transferred to PACU in stable condition.  At the end of the procedure sponge and instrument counts were correct.

## 2023-02-17 NOTE — Transfer of Care (Signed)
Immediate Anesthesia Transfer of Care Note  Patient: Michael Cooley  Procedure(s) Performed: XI ROBOT ASSISTED UMBILICAL HERNIA REPAIR w/ mesh (Abdomen)  Patient Location: PACU  Anesthesia Type:General  Level of Consciousness: drowsy  Airway & Oxygen Therapy: Patient Spontanous Breathing and Patient connected to face mask  Post-op Assessment: Report given to RN and Post -op Vital signs reviewed and stable  Post vital signs: Reviewed and stable  Last Vitals:  Vitals Value Taken Time  BP 111/60 02/17/23 1002  Temp    Pulse 59 02/17/23 1005  Resp 17 02/17/23 1005  SpO2 100 % 02/17/23 1005  Vitals shown include unvalidated device data.  Last Pain:  Vitals:   02/17/23 0628  TempSrc: Temporal  PainSc: 5          Complications: No notable events documented.
